# Patient Record
Sex: Female | Born: 1990
Health system: Southern US, Community
[De-identification: ages and names within clinical notes are randomized; demographics above are authoritative.]

## PROBLEM LIST (undated history)

## (undated) ENCOUNTER — Inpatient Hospital Stay (HOSPITAL_COMMUNITY): Payer: Self-pay

## (undated) DIAGNOSIS — Z789 Other specified health status: Secondary | ICD-10-CM

## (undated) DIAGNOSIS — D649 Anemia, unspecified: Secondary | ICD-10-CM

## (undated) DIAGNOSIS — F419 Anxiety disorder, unspecified: Secondary | ICD-10-CM

## (undated) DIAGNOSIS — F32A Depression, unspecified: Secondary | ICD-10-CM

## (undated) DIAGNOSIS — F329 Major depressive disorder, single episode, unspecified: Secondary | ICD-10-CM

## (undated) DIAGNOSIS — O139 Gestational [pregnancy-induced] hypertension without significant proteinuria, unspecified trimester: Secondary | ICD-10-CM

## (undated) HISTORY — DX: Gestational (pregnancy-induced) hypertension without significant proteinuria, unspecified trimester: O13.9

## (undated) HISTORY — PX: NO PAST SURGERIES: SHX2092

## (undated) HISTORY — DX: Other specified health status: Z78.9

---
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
## 2011-03-08 NOTE — L&D Delivery Note (Signed)
Delivery Note At 6:57 PM a viable female was delivered via Vaginal, Spontaneous Delivery (Presentation: ; Occiput Anterior).  APGAR: 9, 9; weight .   Placenta status: Intact, Spontaneous.  Cord: 3 vessels with the following complications: None.  Cord pH: not done  Anesthesia: Epidural  Episiotomy: None Lacerations: 1st degree;Sulcus Suture Repair: 2.0 vicryl Est. Blood Loss (mL):   Mom to postpartum.  Baby to nursery-stable.  Alaa Mullally A 09/06/2011, 7:07 PM

## 2011-05-30 ENCOUNTER — Encounter (HOSPITAL_COMMUNITY): Payer: Self-pay | Admitting: *Deleted

## 2011-05-30 ENCOUNTER — Inpatient Hospital Stay (HOSPITAL_COMMUNITY)
Admission: AD | Admit: 2011-05-30 | Discharge: 2011-05-30 | Disposition: A | Discharge status: Home / Self Care | Payer: Medicaid Other | Source: Ambulatory Visit | Attending: Obstetrics | Admitting: Obstetrics

## 2011-05-30 DIAGNOSIS — O4700 False labor before 37 completed weeks of gestation, unspecified trimester: Secondary | ICD-10-CM | POA: Insufficient documentation

## 2011-05-30 LAB — URINALYSIS, ROUTINE W REFLEX MICROSCOPIC
Nitrite: NEGATIVE
Protein, ur: NEGATIVE mg/dL
Specific Gravity, Urine: <1.005 — ABNORMAL LOW (ref 1.005–1.030)
Urobilinogen, UA: 0.2 mg/dL (ref 0.0–1.0)

## 2011-05-30 LAB — WET PREP, GENITAL: Trich, Wet Prep: NONE SEEN

## 2011-05-30 LAB — URINE MICROSCOPIC-ADD ON

## 2011-05-30 MED ORDER — NIFEDIPINE ER OSMOTIC RELEASE 60 MG PO TB24: 60.0000 mg | ORAL_TABLET | Freq: Every day | ORAL | Status: DC

## 2011-05-30 MED ORDER — METRONIDAZOLE 500 MG PO TABS: 500.0000 mg | ORAL_TABLET | Freq: Two times a day (BID) | ORAL | Status: AC

## 2011-05-30 MED ORDER — TERBUTALINE SULFATE 1 MG/ML IJ SOLN
INTRAMUSCULAR | Status: AC
  Administered 2011-05-30: 0.25 mg via SUBCUTANEOUS
  Filled 2011-05-30: qty 1

## 2011-05-30 MED ORDER — TERBUTALINE SULFATE 1 MG/ML IJ SOLN: 0.2500 mg | Freq: Once | INTRAMUSCULAR | Status: DC

## 2011-05-30 NOTE — MAU Provider Note (Signed)
  History     CSN: [id number]  Arrival date and time: 05/30/11 1422   First Provider Initiated Contact with Patient 05/30/11 1455      Chief Complaint  Patient presents with  . Contractions   HPI 21 year old primigravida at 25 weeks 0 days brought in by her mother due to 1 hour history of intermittent abdominal pains occurring about every 3 minutes and lasting several seconds. Of note they state she's been having vaginal spotting throughout the pregnancy and is on bed rest and pelvic rest. She was treated for Chlamydia about a month ago. Denies leakage of fluid. Good fetal movement.     OB History    Grav Para Term Preterm Abortions TAB SAB Ect Mult Living   1 0 0 0 0 0 0 0 0 0       Past Medical History  Diagnosis Date  . No pertinent past medical history     Past Surgical History  Procedure Date  . No past surgeries     Family History  Problem Relation Age of Onset  . Hypertension Father     History  Substance Use Topics  . Smoking status: Never Smoker   . Smokeless tobacco: Not on file  . Alcohol Use: No   Allergies: No Known Allergies  Prescriptions prior to admission  Medication Sig Dispense Refill  . ibuprofen (ADVIL,MOTRIN) 200 MG tablet Take 400 mg by mouth every 6 (six) hours as needed. For headache        ROS Physical Exam   Blood pressure 131/59, pulse 90, temperature 98.6 F (37 C), temperature source Oral, resp. rate 18, height 5\' 3"  (1.6 m), weight 58.968 kg (130 lb).  Physical Exam  Constitutional: She is oriented to person, place, and time. She appears well-developed and well-nourished. No distress.  HENT:  Head: Normocephalic.  Neck: Normal range of motion.  GI: Soft. There is no tenderness.       S=D  Genitourinary:       Small amt pink blood on Q tips. GC/CT, WP sent. fFN obtained, not sent.  VE: cx soft, mid, L/C/H  Musculoskeletal: Normal range of motion.  Neurological: She is alert and oriented to person, place, and time.    Skin: Skin is warm and dry.  Psychiatric: She has a normal mood and affect.    MAU Course  Procedures  Results for orders placed during the hospital encounter of 05/30/11 (from the past 24 hour(s))  WET PREP, GENITAL     Status: Abnormal   Collection Time   05/30/11  3:10 PM      Component Value Range   Yeast Wet Prep HPF POC NONE SEEN  NONE SEEN    Trich, Wet Prep NONE SEEN  NONE SEEN    Clue Cells Wet Prep HPF POC FEW (*) NONE SEEN    WBC, Wet Prep HPF POC TOO NUMEROUS TO COUNT (*) NONE SEEN     Toco: minimal UI, readjusted and few mild UCs FHR: 140's, no decles Assessment and Plan  Threatened PTL-> C/W Dr. Gaynell Face give terbutaline here and if effective, home on procardia 60 XL BV -> Rx Flagyl and F/U at [facility] in 2 days  Jennifer Elliott 05/30/2011, 3:36 PM

## 2011-05-30 NOTE — MAU Note (Signed)
Pt in for contractions.  States they started an hour ago and are about every 3 minutes.  Denies any leaking of fluid.  Denies any bleeding today, but states she has had bleeding off and on x4 weeks.  Reports a lot of pelvic pressure.  + FM.  No recent intercourse.

## 2011-05-30 NOTE — Discharge Instructions (Signed)

## 2011-05-30 NOTE — MAU Provider Note (Signed)
History     CSN: [id number]  Arrival date and time: 05/30/11 1422   First Provider Initiated Contact with Patient 05/30/11 1455      Chief Complaint  Patient presents with  . Contractions   HPI  21 year old primigravida at 25 weeks 0 days brought in by her mother due to 1 hour history of intermittent abdominal pains occurring about every 3 minutes and lasting several seconds. Of note they state she's been having vaginal spotting throughout the pregnancy and is on bed rest and pelvic rest. She was treated for Chlamydia about a month ago. Denies leakage of fluid. Good fetal movement.     OB History    Grav Para Term Preterm Abortions TAB SAB Ect Mult Living   1 0 0 0 0 0 0 0 0 0       Past Medical History  Diagnosis Date  . No pertinent past medical history     Past Surgical History  Procedure Date  . No past surgeries     Family History  Problem Relation Age of Onset  . Hypertension Father     History  Substance Use Topics  . Smoking status: Never Smoker   . Smokeless tobacco: Not on file  . Alcohol Use: No   Allergies: No Known Allergies  Prescriptions prior to admission  Medication Sig Dispense Refill  . ibuprofen (ADVIL,MOTRIN) 200 MG tablet Take 400 mg by mouth every 6 (six) hours as needed. For headache        ROS  Physical Exam   Blood pressure 131/59, pulse 90, temperature 98.6 F (37 C), temperature source Oral, resp. rate 18, height 5\' 3"  (1.6 m), weight 58.968 kg (130 lb).  Physical Exam  Constitutional: She is oriented to person, place, and time. She appears well-developed and well-nourished. No distress.  HENT:  Head: Normocephalic.  Neck: Normal range of motion.  GI: Soft. There is no tenderness.       S=D  Genitourinary:       Small amt pink blood on Q tips. GC/CT, WP sent. fFN obtained, not sent.  VE: cx soft, mid, L/C/H  Musculoskeletal: Normal range of motion.  Neurological: She is alert and oriented to person, place, and time.    Skin: Skin is warm and dry.  Psychiatric: She has a normal mood and affect.    MAU Course  Procedures   Results for orders placed during the hospital encounter of 05/30/11 (from the past 24 hour(s))  WET PREP, GENITAL     Status: Abnormal   Collection Time   05/30/11  3:10 PM      Component Value Range   Yeast Wet Prep HPF POC NONE SEEN  NONE SEEN    Trich, Wet Prep NONE SEEN  NONE SEEN    Clue Cells Wet Prep HPF POC FEW (*) NONE SEEN    WBC, Wet Prep HPF POC TOO NUMEROUS TO COUNT (*) NONE SEEN     Toco: minimal UI, readjusted and few mild UCs FHR: 140's, no decles Assessment and Plan  Threatened PTL-> C/W Dr. Gaynell Face give terbutaline here and if effective, home on procardia 60 XL BV -> Rx Flagyl and F/U at [facility] in 2 days  Bacterial vaginosis  Assumed care at 1700 from Caren Griffins, CNM Pt reports no contractions and none on monitor following terbutaline  P: D/C home with PTL precautions Flagyl 500 mg BID x7 days Procardia 60 XL daily F/U with Dr Gaynell Face Return to MAU as needed  LEFTW[place]KIRBY,  Christobal Morado 05/30/2011, 5:16 PM

## 2011-08-08 ENCOUNTER — Inpatient Hospital Stay (HOSPITAL_COMMUNITY)
Admission: AD | Admit: 2011-08-08 | Discharge: 2011-08-08 | Disposition: A | Discharge status: Home / Self Care | Payer: Medicaid Other | Source: Ambulatory Visit | Attending: Obstetrics | Admitting: Obstetrics

## 2011-08-08 ENCOUNTER — Encounter (HOSPITAL_COMMUNITY): Payer: Self-pay | Admitting: *Deleted

## 2011-08-08 DIAGNOSIS — O479 False labor, unspecified: Secondary | ICD-10-CM

## 2011-08-08 DIAGNOSIS — O2390 Unspecified genitourinary tract infection in pregnancy, unspecified trimester: Secondary | ICD-10-CM | POA: Insufficient documentation

## 2011-08-08 DIAGNOSIS — B373 Candidiasis of vulva and vagina: Secondary | ICD-10-CM

## 2011-08-08 DIAGNOSIS — B3731 Acute candidiasis of vulva and vagina: Secondary | ICD-10-CM | POA: Insufficient documentation

## 2011-08-08 DIAGNOSIS — O4700 False labor before 37 completed weeks of gestation, unspecified trimester: Secondary | ICD-10-CM | POA: Insufficient documentation

## 2011-08-08 LAB — WET PREP, GENITAL

## 2011-08-08 LAB — POCT FERN TEST: Fern Test: NEGATIVE

## 2011-08-08 LAB — AMNISURE RUPTURE OF MEMBRANE (ROM) NOT AT ARMC: Amnisure ROM: NEGATIVE

## 2011-08-08 MED ORDER — TERCONAZOLE 0.4 % VA CREA: 1.0000 | TOPICAL_CREAM | Freq: Every day | VAGINAL | Status: AC

## 2011-08-08 NOTE — MAU Note (Signed)
Patient state she has a gush of clear fluid at 0500 with contractions after. Reports good fetal movement.

## 2011-08-08 NOTE — MAU Provider Note (Signed)
Chief Complaint:  Labor Eval and Rupture of Membranes   First Provider Initiated Contact with Patient 08/08/11 0908      HPI  Jennifer Elliott is a 21 y.o. G1P0000 at [redacted]w[redacted]d presenting with one episode of LOF at 0500 followed by worsening contractions Q2-5 minutes. Denies vaginal bleeding. Good fetal movement. Pt has not tried anything to relieve UC's. Denies recent IC.  Pregnancy significant for preterm contractions w/out cervical change. Rx'd Procardia. Has not taken in several weeks.    Past Medical History: Past Medical History  Diagnosis Date  . No pertinent past medical history     Past Surgical History: Past Surgical History  Procedure Date  . No past surgeries     Family History: Family History  Problem Relation Age of Onset  . Hypertension Father     Social History: History  Substance Use Topics  . Smoking status: Never Smoker   . Smokeless tobacco: Not on file  . Alcohol Use: No    Allergies: No Known Allergies  Meds:  Prescriptions prior to admission  Medication Sig Dispense Refill  . NIFEdipine (PROCARDIA XL/ADALAT-CC) 60 MG 24 hr tablet Take 1 tablet (60 mg total) by mouth daily.  30 tablet  0  . Prenatal Vit-Fe Fumarate-FA (PRENATAL MULTIVITAMIN) TABS Take 1 tablet by mouth daily.          Physical Exam  Blood pressure 119/75, pulse 110, temperature 97.4 F (36.3 C), temperature source Oral, resp. rate 18. GENERAL: Well-developed, well-nourished female in no acute distress.  HEENT: normocephalic. HEART: normal rate RESP: normal effort ABDOMEN: Soft, nontender, nondistended, gravid, S=D. Uterus NT. EXTREMITIES: Nontender, no edema NEURO: alert and oriented SPECULUM EXAM: Moderate amount of curd-like, odorless discharge w/ small amount of milky, white discharge. Cervix non-friable, w/out lesions or discharge.  Dilation: Closed Effacement (%): 50 Cervical Position: Posterior Station: -2 Presentation: Vertex Exam by:: Ivonne Andrew CNM  FHT:   Baseline 130 , moderate variability, accelerations present, no decelerations Contractions: q 3-7 mins, decreased w/ PO fluids  No cervical change after one hour. No leaking during MAU visit.   Labs: Results for orders placed during the hospital encounter of 08/08/11 (from the past 48 hour(s))  WET PREP, GENITAL     Status: Abnormal   Collection Time   08/08/11  9:20 AM      Component Value Range Comment   Yeast Wet Prep HPF POC FEW (*) NONE SEEN     Trich, Wet Prep NONE SEEN  NONE SEEN     Clue Cells Wet Prep HPF POC FEW (*) NONE SEEN     WBC, Wet Prep HPF POC TOO NUMEROUS TO COUNT (*) NONE SEEN  MODERATE BACTERIA SEEN  CULTURE, BETA STREP (GROUP B ONLY)     Status: Normal (Preliminary result)   Collection Time   08/08/11  9:20 AM      Component Value Range Comment    Pending                 Report Status PENDING     POCT FERN TEST     Status: Normal   Collection Time   08/08/11  9:22 AM      Component Value Range Comment   Fern Test Negative     AMNISURE RUPTURE OF MEMBRANE (ROM)     Status: Normal   Collection Time   08/08/11  9:55 AM      Component Value Range Comment   Amnisure ROM NEGATIVE  Imaging:  NA  Assessment: 1. Vulvovaginal candidiasis   2. Preterm contractions   3.  Intact membranes  Plan: D/C home Pelvic rest x 1 week. Increase fluids and rest Follow-up Information    Follow up with MARSHALL,BERNARD A, MD. (as scheduled or MAU as needed if symptoms worsen)    Contact information:   [address] [place] [address] [phone]         Medication List  As of 08/09/2011  1:47 PM   START taking these medications         terconazole 0.4 % vaginal cream   Commonly known as: TERAZOL 7   Place 1 applicator vaginally at bedtime.         CONTINUE taking these medications         NIFEdipine 60 MG 24 hr tablet   Commonly known as: PROCARDIA XL/ADALAT-CC   Take 1 tablet (60 mg total) by mouth daily.      prenatal  multivitamin Tabs          Where to get your medications    These are the prescriptions that you need to pick up.   You may get these medications from any pharmacy.         terconazole 0.4 % vaginal cream          PTL precautions FKCs  Debera Sterba 6/3/20139:27 AM

## 2011-08-12 LAB — CULTURE, BETA STREP (GROUP B ONLY)

## 2011-08-14 ENCOUNTER — Encounter: Payer: Self-pay | Admitting: Advanced Practice Midwife

## 2011-08-14 DIAGNOSIS — O99820 Streptococcus B carrier state complicating pregnancy: Secondary | ICD-10-CM

## 2011-09-06 ENCOUNTER — Inpatient Hospital Stay (HOSPITAL_COMMUNITY): Payer: Medicaid Other | Admitting: Anesthesiology

## 2011-09-06 ENCOUNTER — Encounter (HOSPITAL_COMMUNITY): Payer: Self-pay | Admitting: Anesthesiology

## 2011-09-06 ENCOUNTER — Encounter (HOSPITAL_COMMUNITY): Payer: Self-pay | Admitting: *Deleted

## 2011-09-06 ENCOUNTER — Inpatient Hospital Stay (HOSPITAL_COMMUNITY)
Admission: AD | Admit: 2011-09-06 | Discharge: 2011-09-08 | DRG: 775 | Disposition: A | Discharge status: Home / Self Care | Payer: Medicaid Other | Source: Ambulatory Visit | Attending: Obstetrics | Admitting: Obstetrics

## 2011-09-06 DIAGNOSIS — O99892 Other specified diseases and conditions complicating childbirth: Secondary | ICD-10-CM | POA: Diagnosis present

## 2011-09-06 DIAGNOSIS — O99820 Streptococcus B carrier state complicating pregnancy: Secondary | ICD-10-CM

## 2011-09-06 DIAGNOSIS — Z22330 Carrier of Group B streptococcus: Secondary | ICD-10-CM

## 2011-09-06 HISTORY — DX: Depression, unspecified: F32.A

## 2011-09-06 HISTORY — DX: Major depressive disorder, single episode, unspecified: F32.9

## 2011-09-06 HISTORY — DX: Anxiety disorder, unspecified: F41.9

## 2011-09-06 HISTORY — DX: Anemia, unspecified: D64.9

## 2011-09-06 LAB — CBC
HCT: 34 % — ABNORMAL LOW (ref 36.0–46.0)
Hemoglobin: 11 g/dL — ABNORMAL LOW (ref 12.0–15.0)
MCH: 27.6 pg (ref 26.0–34.0)
MCV: 85.4 fL (ref 78.0–100.0)
Platelets: 180 10*3/uL (ref 150–400)
RBC: 3.98 MIL/uL (ref 3.87–5.11)
WBC: 8.2 10*3/uL (ref 4.0–10.5)

## 2011-09-06 LAB — OB RESULTS CONSOLE ABO/RH: RH Type: POSITIVE

## 2011-09-06 LAB — OB RESULTS CONSOLE ANTIBODY SCREEN: Antibody Screen: NEGATIVE

## 2011-09-06 LAB — OB RESULTS CONSOLE HIV ANTIBODY (ROUTINE TESTING): HIV: NONREACTIVE

## 2011-09-06 MED ORDER — ACETAMINOPHEN 325 MG PO TABS: 650.0000 mg | ORAL_TABLET | ORAL | Status: DC | PRN

## 2011-09-06 MED ORDER — TETANUS-DIPHTH-ACELL PERTUSSIS 5-2.5-18.5 LF-MCG/0.5 IM SUSP
0.5000 mL | Freq: Once | INTRAMUSCULAR | Status: AC
  Administered 2011-09-07: 0.5 mL via INTRAMUSCULAR
  Filled 2011-09-06: qty 0.5

## 2011-09-06 MED ORDER — OXYTOCIN 40 UNITS IN LACTATED RINGERS INFUSION - SIMPLE MED
1.0000 m[IU]/min | INTRAVENOUS | Status: DC
  Administered 2011-09-06: 1 m[IU]/min via INTRAVENOUS
  Administered 2011-09-06: 666 m[IU]/min via INTRAVENOUS
  Filled 2011-09-06: qty 1000

## 2011-09-06 MED ORDER — LACTATED RINGERS IV SOLN
INTRAVENOUS | Status: DC
  Administered 2011-09-06 (×2): via INTRAVENOUS

## 2011-09-06 MED ORDER — BUTORPHANOL TARTRATE 2 MG/ML IJ SOLN: 1.0000 mg | INTRAMUSCULAR | Status: DC | PRN

## 2011-09-06 MED ORDER — ONDANSETRON HCL 4 MG PO TABS: 4.0000 mg | ORAL_TABLET | ORAL | Status: DC | PRN

## 2011-09-06 MED ORDER — FENTANYL 2.5 MCG/ML BUPIVACAINE 1/10 % EPIDURAL INFUSION (WH - ANES)
INTRAMUSCULAR | Status: DC | PRN
  Administered 2011-09-06: 14 mL/h via EPIDURAL

## 2011-09-06 MED ORDER — OXYTOCIN 40 UNITS IN LACTATED RINGERS INFUSION - SIMPLE MED: 62.5000 mL/h | Freq: Once | INTRAVENOUS | Status: DC

## 2011-09-06 MED ORDER — SIMETHICONE 80 MG PO CHEW: 80.0000 mg | CHEWABLE_TABLET | ORAL | Status: DC | PRN

## 2011-09-06 MED ORDER — LACTATED RINGERS IV SOLN: 500.0000 mL | INTRAVENOUS | Status: DC | PRN

## 2011-09-06 MED ORDER — OXYCODONE-ACETAMINOPHEN 5-325 MG PO TABS
1.0000 | ORAL_TABLET | ORAL | Status: DC | PRN
  Administered 2011-09-08: 1 via ORAL
  Filled 2011-09-06: qty 1

## 2011-09-06 MED ORDER — OXYCODONE-ACETAMINOPHEN 5-325 MG PO TABS: 1.0000 | ORAL_TABLET | ORAL | Status: DC | PRN

## 2011-09-06 MED ORDER — LIDOCAINE HCL (PF) 1 % IJ SOLN
INTRAMUSCULAR | Status: DC | PRN
  Administered 2011-09-06: 7 mL
  Administered 2011-09-06: 6 mL

## 2011-09-06 MED ORDER — DIBUCAINE 1 % RE OINT: 1.0000 "application " | TOPICAL_OINTMENT | RECTAL | Status: DC | PRN

## 2011-09-06 MED ORDER — FERROUS SULFATE 325 (65 FE) MG PO TABS
325.0000 mg | ORAL_TABLET | Freq: Two times a day (BID) | ORAL | Status: DC
  Administered 2011-09-07 – 2011-09-08 (×3): 325 mg via ORAL
  Filled 2011-09-06 (×3): qty 1

## 2011-09-06 MED ORDER — PENICILLIN G POTASSIUM 5000000 UNITS IJ SOLR
2.5000 10*6.[IU] | INTRAVENOUS | Status: DC
  Administered 2011-09-06: 2.5 10*6.[IU] via INTRAVENOUS
  Filled 2011-09-06 (×4): qty 2.5

## 2011-09-06 MED ORDER — PRENATAL MULTIVITAMIN CH
1.0000 | ORAL_TABLET | Freq: Every day | ORAL | Status: DC
  Administered 2011-09-07 – 2011-09-08 (×2): 1 via ORAL
  Filled 2011-09-06 (×2): qty 1

## 2011-09-06 MED ORDER — SENNOSIDES-DOCUSATE SODIUM 8.6-50 MG PO TABS
2.0000 | ORAL_TABLET | Freq: Every day | ORAL | Status: DC
  Administered 2011-09-07: 2 via ORAL

## 2011-09-06 MED ORDER — OXYTOCIN BOLUS FROM INFUSION
250.0000 mL | Freq: Once | INTRAVENOUS | Status: DC
  Filled 2011-09-06: qty 500

## 2011-09-06 MED ORDER — LACTATED RINGERS IV SOLN: 500.0000 mL | Freq: Once | INTRAVENOUS | Status: DC

## 2011-09-06 MED ORDER — LIDOCAINE HCL (PF) 1 % IJ SOLN: 30.0000 mL | INTRAMUSCULAR | Status: DC | PRN

## 2011-09-06 MED ORDER — FLEET ENEMA 7-19 GM/118ML RE ENEM: 1.0000 | ENEMA | RECTAL | Status: DC | PRN

## 2011-09-06 MED ORDER — DIPHENHYDRAMINE HCL 25 MG PO CAPS: 25.0000 mg | ORAL_CAPSULE | Freq: Four times a day (QID) | ORAL | Status: DC | PRN

## 2011-09-06 MED ORDER — LANOLIN HYDROUS EX OINT: TOPICAL_OINTMENT | CUTANEOUS | Status: DC | PRN

## 2011-09-06 MED ORDER — EPHEDRINE 5 MG/ML INJ
10.0000 mg | INTRAVENOUS | Status: DC | PRN
  Filled 2011-09-06: qty 4

## 2011-09-06 MED ORDER — CITRIC ACID-SODIUM CITRATE 334-500 MG/5ML PO SOLN: 30.0000 mL | ORAL | Status: DC | PRN

## 2011-09-06 MED ORDER — DIPHENHYDRAMINE HCL 50 MG/ML IJ SOLN: 12.5000 mg | INTRAMUSCULAR | Status: DC | PRN

## 2011-09-06 MED ORDER — WITCH HAZEL-GLYCERIN EX PADS: 1.0000 "application " | MEDICATED_PAD | CUTANEOUS | Status: DC | PRN

## 2011-09-06 MED ORDER — BENZOCAINE-MENTHOL 20-0.5 % EX AERO: 1.0000 "application " | INHALATION_SPRAY | CUTANEOUS | Status: DC | PRN

## 2011-09-06 MED ORDER — EPHEDRINE 5 MG/ML INJ: 10.0000 mg | INTRAVENOUS | Status: DC | PRN

## 2011-09-06 MED ORDER — FENTANYL 2.5 MCG/ML BUPIVACAINE 1/10 % EPIDURAL INFUSION (WH - ANES)
14.0000 mL/h | INTRAMUSCULAR | Status: DC
  Administered 2011-09-06: 14 mL/h via EPIDURAL
  Filled 2011-09-06 (×2): qty 60

## 2011-09-06 MED ORDER — IBUPROFEN 600 MG PO TABS
600.0000 mg | ORAL_TABLET | Freq: Four times a day (QID) | ORAL | Status: DC
  Administered 2011-09-07 – 2011-09-08 (×5): 600 mg via ORAL
  Filled 2011-09-06: qty 1

## 2011-09-06 MED ORDER — PHENYLEPHRINE 40 MCG/ML (10ML) SYRINGE FOR IV PUSH (FOR BLOOD PRESSURE SUPPORT): 80.0000 ug | PREFILLED_SYRINGE | INTRAVENOUS | Status: DC | PRN

## 2011-09-06 MED ORDER — ONDANSETRON HCL 4 MG/2ML IJ SOLN: 4.0000 mg | INTRAMUSCULAR | Status: DC | PRN

## 2011-09-06 MED ORDER — TERBUTALINE SULFATE 1 MG/ML IJ SOLN: 0.2500 mg | Freq: Once | INTRAMUSCULAR | Status: DC | PRN

## 2011-09-06 MED ORDER — ZOLPIDEM TARTRATE 5 MG PO TABS: 5.0000 mg | ORAL_TABLET | Freq: Every evening | ORAL | Status: DC | PRN

## 2011-09-06 MED ORDER — ONDANSETRON HCL 4 MG/2ML IJ SOLN: 4.0000 mg | Freq: Four times a day (QID) | INTRAMUSCULAR | Status: DC | PRN

## 2011-09-06 MED ORDER — IBUPROFEN 600 MG PO TABS
600.0000 mg | ORAL_TABLET | Freq: Four times a day (QID) | ORAL | Status: DC | PRN
  Administered 2011-09-06: 600 mg via ORAL
  Filled 2011-09-06 (×4): qty 1

## 2011-09-06 MED ORDER — PENICILLIN G POTASSIUM 5000000 UNITS IJ SOLR
5.0000 10*6.[IU] | Freq: Once | INTRAVENOUS | Status: AC
  Administered 2011-09-06: 5 10*6.[IU] via INTRAVENOUS
  Filled 2011-09-06: qty 5

## 2011-09-06 MED ORDER — PHENYLEPHRINE 40 MCG/ML (10ML) SYRINGE FOR IV PUSH (FOR BLOOD PRESSURE SUPPORT)
80.0000 ug | PREFILLED_SYRINGE | INTRAVENOUS | Status: DC | PRN
  Filled 2011-09-06: qty 5

## 2011-09-06 NOTE — H&P (Signed)
This is Dr. Selinda Orion at [facility] dictated history and physical on  Jennifer Elliott  she is a 21 year old  primip at  39 weeks and two days  a positive GBS and received penicillin her membranes ruptured spontaneously  At 755 am and the cervix is 1 cm 100% and vertex at a 0 station the fluid  is clear and she is contracting every 4 minutes Past medical history negative Part surgical history measurement Family history negative System review noncontributory Physical exam revealed a well-developed female in early labor HEENT negative Lungs clear Heart regular rhythm no murmurs no gallops Breasts engorged Abdomen her Pelvic as described above Extremities negative

## 2011-09-06 NOTE — MAU Note (Signed)
4098 started leaking clear fluid, still coming.  Cramping started at 0830.  Denies problems with preg, was 1 cm when last checked.

## 2011-09-06 NOTE — Anesthesia Preprocedure Evaluation (Signed)
Anesthesia Evaluation  Patient identified by MRN, date of birth, ID band Patient awake    Reviewed: Allergy & Precautions, H&P , NPO status , Patient's Chart, lab work & pertinent test results  Airway Mallampati: I TM Distance: >3 FB Neck ROM: full    Dental No notable dental hx.    Pulmonary neg pulmonary ROS,  breath sounds clear to auscultation  Pulmonary exam normal       Cardiovascular negative cardio ROS      Neuro/Psych PSYCHIATRIC DISORDERS Anxiety Depression negative neurological ROS     GI/Hepatic negative GI ROS, Neg liver ROS,   Endo/Other  negative endocrine ROS  Renal/GU negative Renal ROS  negative genitourinary   Musculoskeletal negative musculoskeletal ROS (+)   Abdominal Normal abdominal exam  (+)   Peds negative pediatric ROS (+)  Hematology negative hematology ROS (+)   Anesthesia Other Findings   Reproductive/Obstetrics (+) Pregnancy                           Anesthesia Physical Anesthesia Plan  ASA: II  Anesthesia Plan: Epidural   Post-op Pain Management:    Induction:   Airway Management Planned:   Additional Equipment:   Intra-op Plan:   Post-operative Plan:   Informed Consent: I have reviewed the patients History and Physical, chart, labs and discussed the procedure including the risks, benefits and alternatives for the proposed anesthesia with the patient or authorized representative who has indicated his/her understanding and acceptance.     Plan Discussed with:   Anesthesia Plan Comments:         Anesthesia Quick Evaluation

## 2011-09-06 NOTE — Anesthesia Procedure Notes (Signed)
Epidural Patient location during procedure: OB Start time: 09/06/2011 11:55 AM End time: 09/06/2011 11:59 AM Reason for block: procedure for pain  Staffing Anesthesiologist: Sandrea Hughs Performed by: anesthesiologist   Preanesthetic Checklist Completed: patient identified, site marked, surgical consent, pre-op evaluation, timeout performed, IV checked, risks and benefits discussed and monitors and equipment checked  Epidural Patient position: sitting Prep: site prepped and draped and DuraPrep Patient monitoring: continuous pulse ox and blood pressure Approach: midline Injection technique: LOR air  Needle:  Needle type: Tuohy  Needle gauge: 17 G Needle length: 9 cm Needle insertion depth: 5 cm cm Catheter type: closed end flexible Catheter size: 19 Gauge Catheter at skin depth: 10 cm Test dose: negative  Assessment Sensory level: T8 Events: blood not aspirated, injection not painful, no injection resistance, negative IV test and no paresthesia

## 2011-09-07 LAB — CBC
MCH: 28.1 pg (ref 26.0–34.0)
MCHC: 32.7 g/dL (ref 30.0–36.0)
Platelets: 176 10*3/uL (ref 150–400)
RBC: 3.66 MIL/uL — ABNORMAL LOW (ref 3.87–5.11)

## 2011-09-07 NOTE — Progress Notes (Signed)
UR chart review completed.  

## 2011-09-07 NOTE — Progress Notes (Signed)
Patient ID: Jennifer Elliott, female   DOB: [birth year], 21 y.o.   MRN: [id number] Postpartum day one Fundus firm Lochia moderate Legs negative No complaints

## 2011-09-07 NOTE — Anesthesia Postprocedure Evaluation (Signed)
  Anesthesia Post-op Note  Patient: Jennifer Elliott  Procedure(s) Performed: * No procedures listed *  Patient Location: Mother/Baby  Anesthesia Type: Epidural  Level of Consciousness: awake  Airway and Oxygen Therapy: Patient Spontanous Breathing  Post-op Pain: none  Post-op Assessment: Patient's Cardiovascular Status Stable, Respiratory Function Stable, No signs of Nausea or vomiting, Adequate PO intake, Pain level controlled, No headache, No backache, No residual numbness and No residual motor weakness  Post-op Vital Signs: Reviewed and stable  Complications: No apparent anesthesia complications

## 2011-09-08 NOTE — Discharge Summary (Signed)
Obstetric Discharge Summary Reason for Admission: onset of labor Prenatal Procedures: none Intrapartum Procedures: spontaneous vaginal delivery Postpartum Procedures: none Complications-Operative and Postpartum: none Hemoglobin  Date Value Range Status  09/07/2011 10.3* 12.0 - 15.0 g/dL Final     HCT  Date Value Range Status  09/07/2011 31.5* 36.0 - 46.0 % Final    Physical Exam:  General: alert Lochia: appropriate Uterine Fundus: firm Incision: healing well DVT Evaluation: No evidence of DVT seen on physical exam.  Discharge Diagnoses: Term Pregnancy-delivered  Discharge Information: Date: 09/08/2011 Activity: pelvic rest Diet: routine Medications: Percocet Condition: stable Instructions: refer to practice specific booklet Discharge to: home Follow-up Information    Follow up with Mainor Hellmann A, MD. Call in 6 weeks.   Contact information:   [address] [place] [address] [phone]          Newborn Data: Live born female  Birth Weight: 5 lb 12.6 oz (2625 g) APGAR: 9, 9  Home with mother.  Jennifer Elliott A 09/08/2011, 7:02 AM

## 2014-01-06 ENCOUNTER — Encounter (HOSPITAL_COMMUNITY): Payer: Self-pay | Admitting: *Deleted

## 2015-04-15 ENCOUNTER — Inpatient Hospital Stay (HOSPITAL_COMMUNITY)
Admission: AD | Admit: 2015-04-15 | Discharge: 2015-04-15 | Disposition: A | Discharge status: Home / Self Care | Payer: Medicaid Other | Source: Ambulatory Visit | Attending: Obstetrics and Gynecology | Admitting: Obstetrics and Gynecology

## 2015-04-15 DIAGNOSIS — M545 Low back pain: Secondary | ICD-10-CM | POA: Insufficient documentation

## 2015-04-15 LAB — URINALYSIS, ROUTINE W REFLEX MICROSCOPIC
Bilirubin Urine: NEGATIVE
GLUCOSE, UA: NEGATIVE mg/dL
KETONES UR: NEGATIVE mg/dL
Nitrite: NEGATIVE
PH: 5.5 (ref 5.0–8.0)
Protein, ur: NEGATIVE mg/dL
Specific Gravity, Urine: 1.02 (ref 1.005–1.030)

## 2015-04-15 LAB — URINE MICROSCOPIC-ADD ON: RBC / HPF: NONE SEEN RBC/hpf (ref 0–5)

## 2015-04-15 LAB — POCT PREGNANCY, URINE: Preg Test, Ur: NEGATIVE

## 2015-04-15 NOTE — MAU Provider Note (Signed)
Ms Enis Gash is a 24yo G1P1001 here for vague, unrelated symptoms of low back discomfort and occ vomiting w/ the last time being 1.5wks ago.  [facility] in January with all results negative (per pt). Has Nexplanon currently in place. Interested in finding out pregnancy status. No fever, no dysuria.   UA- mod hgb, tr leuk Micro- 0-5 SE, rare bacteria  Rev'd neg UPT. Pt not interested in having further eval of intermittent low back discomfort. Will send urine to culture as precaution.  Cam Hai CNM 04/15/2015 10:35 PM

## 2015-04-15 NOTE — MAU Note (Signed)
Pt states she has been having lower abd pain and lower back pain x 3 days. States she thinks she might be pregnant due to nausea. Negative preg test one week ago.

## 2015-04-17 LAB — URINE CULTURE: SPECIAL REQUESTS: NORMAL

## 2016-08-13 ENCOUNTER — Emergency Department (HOSPITAL_COMMUNITY)
Admission: EM | Admit: 2016-08-13 | Discharge: 2016-08-13 | Disposition: A | Discharge status: Home / Self Care | Payer: Medicaid Other | Attending: Emergency Medicine | Admitting: Emergency Medicine

## 2016-08-13 ENCOUNTER — Emergency Department (HOSPITAL_COMMUNITY): Payer: Medicaid Other

## 2016-08-13 ENCOUNTER — Encounter (HOSPITAL_COMMUNITY): Payer: Self-pay | Admitting: Emergency Medicine

## 2016-08-13 DIAGNOSIS — B349 Viral infection, unspecified: Secondary | ICD-10-CM | POA: Diagnosis not present

## 2016-08-13 DIAGNOSIS — R079 Chest pain, unspecified: Secondary | ICD-10-CM | POA: Diagnosis present

## 2016-08-13 DIAGNOSIS — Z87891 Personal history of nicotine dependence: Secondary | ICD-10-CM | POA: Insufficient documentation

## 2016-08-13 LAB — CBC
HEMATOCRIT: 40.9 % (ref 36.0–46.0)
Hemoglobin: 13.4 g/dL (ref 12.0–15.0)
MCH: 31.3 pg (ref 26.0–34.0)
MCHC: 32.8 g/dL (ref 30.0–36.0)
MCV: 95.6 fL (ref 78.0–100.0)
PLATELETS: 237 10*3/uL (ref 150–400)
RBC: 4.28 MIL/uL (ref 3.87–5.11)
RDW: 14.1 % (ref 11.5–15.5)
WBC: 6.3 10*3/uL (ref 4.0–10.5)

## 2016-08-13 LAB — BASIC METABOLIC PANEL
Anion gap: 8 (ref 5–15)
BUN: 11 mg/dL (ref 6–20)
CHLORIDE: 106 mmol/L (ref 101–111)
CO2: 21 mmol/L — ABNORMAL LOW (ref 22–32)
CREATININE: 0.75 mg/dL (ref 0.44–1.00)
Calcium: 9.2 mg/dL (ref 8.9–10.3)
GFR calc Af Amer: >60 mL/min (ref >60)
GFR calc non Af Amer: >60 mL/min (ref >60)
GLUCOSE: 93 mg/dL (ref 65–99)
POTASSIUM: 3.6 mmol/L (ref 3.5–5.1)
SODIUM: 135 mmol/L (ref 135–145)

## 2016-08-13 LAB — RAPID STREP SCREEN (MED CTR MEBANE ONLY): STREPTOCOCCUS, GROUP A SCREEN (DIRECT): NEGATIVE

## 2016-08-13 LAB — I-STAT TROPONIN, ED: Troponin i, poc: 0 ng/mL (ref 0.00–0.08)

## 2016-08-13 MED ORDER — IBUPROFEN 800 MG PO TABS
800.0000 mg | ORAL_TABLET | Freq: Once | ORAL | Status: AC
  Administered 2016-08-13: 800 mg via ORAL
  Filled 2016-08-13: qty 1

## 2016-08-13 NOTE — ED Triage Notes (Signed)
Pt c/o left sided chest pain that shoots across to right side with dizziness ongoing for 2 weeks. Pt reports sore throat x 1 week.

## 2016-08-13 NOTE — ED Provider Notes (Signed)
[facility]-EMERGENCY DEPT Provider Note   CSN: [id number] Arrival date & time: 08/13/16  1316     History   Chief Complaint Chief Complaint  Patient presents with  . Chest Pain  . Sore Throat    HPI Jennifer Elliott is a 26 y.o. female.  HPI  26 y.o. Female with uri sxs, sore throat and left and right chest pressure.  No cough, dyspnea, or weakness.  She has had some subjective fever, chills, and nausea without vomiting.  Symptoms improving.    Past Medical History:  Diagnosis Date  . Anemia   . Anxiety   . Depression    doing good    Patient Active Problem List   Diagnosis Date Noted  . GBS (group B Streptococcus carrier), +RV culture, currently pregnant 08/14/2011    Past Surgical History:  Procedure Laterality Date  . NO PAST SURGERIES      OB History    Gravida Para Term Preterm AB Living   1 1 1  0 0 1   SAB TAB Ectopic Multiple Live Births   0 0 0 0 1       Home Medications    Prior to Admission medications   Not on File    Family History Family History  Problem Relation Age of Onset  . Hypertension Father   . Other Neg Hx     Social History Social History  Substance Use Topics  . Smoking status: Former Games developer  . Smokeless tobacco: Never Used     Comment: 2012  . Alcohol use No     Allergies   Patient has no known allergies.   Review of Systems Review of Systems  All other systems reviewed and are negative.    Physical Exam Updated Vital Signs BP 131/77   Pulse 71   Temp 98.5 F (36.9 C) (Oral)   Resp 18   Ht 1.6 m (5\' 3" )   Wt 48.5 kg (107 lb)   LMP 07/18/2016   SpO2 100%   BMI 18.95 kg/m   Physical Exam  Constitutional: She is oriented to person, place, and time. She appears well-developed and well-nourished. No distress.  HENT:  Head: Normocephalic and atraumatic.  Right Ear: External ear normal.  Left Ear: External ear normal.  Nose: Nose normal.  Eyes: Conjunctivae and EOM are normal. Pupils are equal, round,  and reactive to light.  Neck: Normal range of motion. Neck supple.  Cardiovascular: Normal rate.   Pulmonary/Chest: Effort normal.  Abdominal: Soft.  Musculoskeletal: Normal range of motion.  Neurological: She is alert and oriented to person, place, and time. She exhibits normal muscle tone. Coordination normal.  Skin: Skin is warm and dry.  Psychiatric: She has a normal mood and affect. Her behavior is normal. Thought content normal.  Nursing note and vitals reviewed.    ED Treatments / Results  Labs (all labs ordered are listed, but only abnormal results are displayed) Labs Reviewed  BASIC METABOLIC PANEL - Abnormal; Notable for the following:       Result Value   CO2 21 (*)    All other components within normal limits  RAPID STREP SCREEN (NOT AT [facility])  CULTURE, GROUP A STREP [facility])  CBC  I-STAT TROPOININ, ED    EKG  EKG Interpretation  Date/Time:  Saturday August 13 2016 13:24:27 EDT Ventricular Rate:  72 PR Interval:  134 QRS Duration: 84 QT Interval:  402 QTC Calculation: 440 R Axis:   88 Text Interpretation:  Normal  sinus rhythm with sinus arrhythmia Normal ECG Confirmed by Shayden Bobier MD, Duwayne HeckANIELLE [phone]) on 08/13/2016 3:51:11 PM       Radiology Dg Chest 2 View  Result Date: 08/13/2016 CLINICAL DATA:  Chest pain EXAM: CHEST  2 VIEW COMPARISON:  None. FINDINGS: Lungs are clear. Heart size and pulmonary vascularity are normal. No adenopathy. No pneumothorax. No bone lesions. IMPRESSION: No edema or consolidation. Electronically Signed   By: Bretta BangWilliam  Woodruff III M.D.   On: 08/13/2016 14:17    Procedures Procedures (including critical care time)  Medications Ordered in ED Medications - No data to display   Initial Impression / Assessment and Plan / ED Course  I have reviewed the triage vital signs and the nursing notes.  Pertinent labs & imaging results that were available during my care of the patient were reviewed by me and considered in my medical decision making  (see chart for details).       Final Clinical Impressions(s) / ED Diagnoses   Final diagnoses:  Viral syndrome  Chest pain, unspecified type    New Prescriptions New Prescriptions   No medications on file     Margarita Grizzleay, Pierre Cumpton, MD 08/13/16 1609

## 2016-08-15 LAB — CULTURE, GROUP A STREP (THRC)

## 2016-08-21 ENCOUNTER — Emergency Department (HOSPITAL_COMMUNITY)
Admission: EM | Admit: 2016-08-21 | Discharge: 2016-08-21 | Disposition: A | Discharge status: Home / Self Care | Payer: Medicaid Other | Attending: Emergency Medicine | Admitting: Emergency Medicine

## 2016-08-21 ENCOUNTER — Encounter (HOSPITAL_COMMUNITY): Payer: Self-pay | Admitting: Emergency Medicine

## 2016-08-21 ENCOUNTER — Emergency Department (HOSPITAL_COMMUNITY): Payer: Medicaid Other

## 2016-08-21 DIAGNOSIS — Y92481 Parking lot as the place of occurrence of the external cause: Secondary | ICD-10-CM | POA: Insufficient documentation

## 2016-08-21 DIAGNOSIS — Y9302 Activity, running: Secondary | ICD-10-CM | POA: Diagnosis not present

## 2016-08-21 DIAGNOSIS — S8002XA Contusion of left knee, initial encounter: Secondary | ICD-10-CM | POA: Diagnosis not present

## 2016-08-21 DIAGNOSIS — Z87891 Personal history of nicotine dependence: Secondary | ICD-10-CM | POA: Insufficient documentation

## 2016-08-21 DIAGNOSIS — W1839XA Other fall on same level, initial encounter: Secondary | ICD-10-CM | POA: Insufficient documentation

## 2016-08-21 DIAGNOSIS — S8992XA Unspecified injury of left lower leg, initial encounter: Secondary | ICD-10-CM | POA: Diagnosis present

## 2016-08-21 DIAGNOSIS — Y999 Unspecified external cause status: Secondary | ICD-10-CM | POA: Insufficient documentation

## 2016-08-21 MED ORDER — IBUPROFEN 800 MG PO TABS
800.0000 mg | ORAL_TABLET | Freq: Once | ORAL | Status: AC
  Administered 2016-08-21: 800 mg via ORAL
  Filled 2016-08-21: qty 1

## 2016-08-21 NOTE — Progress Notes (Signed)
Orthopedic Tech Progress Note Patient Details:  Jennifer Elliott 1991-01-11 [id number]  Ortho Devices Type of Ortho Device: Knee Immobilizer, Crutches Ortho Device/Splint Interventions: Application   Saul FordyceJennifer C Kirbie Stodghill 08/21/2016, 1:34 PM

## 2016-08-21 NOTE — ED Provider Notes (Signed)
[facility]-EMERGENCY DEPT Provider Note   CSN: [id number] Arrival date & time: 08/21/16  1057  By signing my name below, I, Jennifer Elliott, attest that this documentation has been prepared under the direction and in the presence of Margarita Grizzleay, Dyan Creelman, MD. Electronically signed, Jennifer Elliott, ED Scribe. 08/21/16. 12:40 PM.  History   Chief Complaint Chief Complaint  Patient presents with  . Knee Injury    HPI .HPI Comments: Jennifer Elliott is a 26 y.o. female who presents to the Emergency Department s/p a mechanical fall that occurred last night. Pt was running through a parking lot and caught her foot on a parking block. This caused her to fall forward, landing onto her left knee. She did not hit her head; no LOC. Pt has applied ice to the knee with no relief. Her pain is exacerbated during ambulation. She has not taken any medications PTA. No open wound.   The history is provided by the patient. No language interpreter was used.    Past Medical History:  Diagnosis Date  . Anemia   . Anxiety   . Depression    doing good    Patient Active Problem List   Diagnosis Date Noted  . GBS (group B Streptococcus carrier), +RV culture, currently pregnant 08/14/2011    Past Surgical History:  Procedure Laterality Date  . NO PAST SURGERIES      OB History    Gravida Para Term Preterm AB Living   1 1 1  0 0 1   SAB TAB Ectopic Multiple Live Births   0 0 0 0 1      Home Medications    Prior to Admission medications   Not on File    Family History Family History  Problem Relation Age of Onset  . Hypertension Father   . Other Neg Hx     Social History Social History  Substance Use Topics  . Smoking status: Former Games developermoker  . Smokeless tobacco: Never Used     Comment: 2012  . Alcohol use No     Allergies   Patient has no known allergies.   Review of Systems Review of Systems  Musculoskeletal: Positive for arthralgias (left knee) and joint swelling (left knee).    Neurological: Negative for headaches.  All other systems reviewed and are negative.   Physical Exam Updated Vital Signs BP 120/79 (BP Location: Right Arm)   Pulse 60   Temp 98.1 F (36.7 C) (Oral)   Resp (!) 22   Ht 5\' 3"  (1.6 m)   Wt 110 lb (49.9 kg)   LMP 08/18/2016   SpO2 100%   BMI 19.49 kg/m   Physical Exam  Constitutional: She is oriented to person, place, and time. She appears well-developed and well-nourished. No distress.  HENT:  Head: Normocephalic and atraumatic.  Right Ear: External ear normal.  Left Ear: External ear normal.  Nose: Nose normal.  Eyes: Conjunctivae and EOM are normal. Pupils are equal, round, and reactive to light.  Neck: Normal range of motion. Neck supple.  Pulmonary/Chest: Effort normal.  Musculoskeletal: Normal range of motion.  Left knee with medial contusion and ttp, full arom  Neurological: She is alert and oriented to person, place, and time. She exhibits normal muscle tone. Coordination normal.  Skin: Skin is warm and dry.  Psychiatric: She has a normal mood and affect. Her behavior is normal. Thought content normal.  Nursing note and vitals reviewed.    ED Treatments / Results  DIAGNOSTIC STUDIES: Oxygen Saturation  is 100% on RA, normal by my interpretation.  COORDINATION OF CARE: 12:34 PM-Discussed treatment plan with pt at bedside and pt agreed to plan.   Labs (all labs ordered are listed, but only abnormal results are displayed) Labs Reviewed - No data to display  EKG  EKG Interpretation None       Radiology No results found.  Procedures Procedures (including critical care time)  Medications Ordered in ED Medications - No data to display   Initial Impression / Assessment and Plan / ED Course  I have reviewed the triage vital signs and the nursing notes.  Pertinent labs & imaging results that were available during my care of the patient were reviewed by me and considered in my medical decision making (see  chart for details).     Plan knee immobilizer, cold therapy, crutches, f/u if not better in 2-4 days.  Final Clinical Impressions(s) / ED Diagnoses   Final diagnoses:  Contusion of left knee, initial encounter    New Prescriptions New Prescriptions   No medications on file  I personally performed the services described in this documentation, which was scribed in my presence. The recorded information has been reviewed and considered.    Margarita Grizzle, MD 08/21/16 [phone]

## 2016-08-21 NOTE — ED Notes (Signed)
Pt verbalized understanding discharge instructions and denies any further needs or questions at this time. VS stable, ambulatory and steady gait.   

## 2016-08-21 NOTE — ED Triage Notes (Signed)
Pt. Stated, I tripped over a car stopper last night and hurt my left knee

## 2018-03-07 NOTE — L&D Delivery Note (Signed)
Delivery Note At 2003 a viable female infant was delivered via SVD, presentation: OA. APGAR: 9, 9; weight pending.   Placenta status: spontaneously delivered intact with gentle cord traction. Fundus firm with massage and Pitocin.   Anesthesia: epidural Lacerations: none Est. Blood Loss (mL): 100 Placenta to LD Complications nuchal x1, reduced Cord ph n/a   Mom to postpartum. Baby to Couplet care / Skin to Skin.    Julianne Handler, [place] 12/04/2018 8:18 PM

## 2018-06-19 ENCOUNTER — Other Ambulatory Visit (HOSPITAL_COMMUNITY): Payer: Self-pay | Admitting: *Deleted

## 2018-06-19 ENCOUNTER — Encounter (HOSPITAL_COMMUNITY): Payer: Self-pay | Admitting: *Deleted

## 2018-06-19 DIAGNOSIS — Z369 Encounter for antenatal screening, unspecified: Principal | ICD-10-CM

## 2018-06-19 NOTE — Progress Notes (Unsigned)
Us/

## 2018-06-21 ENCOUNTER — Other Ambulatory Visit: Payer: Self-pay

## 2018-06-21 ENCOUNTER — Ambulatory Visit (HOSPITAL_COMMUNITY): Payer: Medicaid Other

## 2018-06-21 ENCOUNTER — Ambulatory Visit (HOSPITAL_COMMUNITY)
Admission: RE | Admit: 2018-06-21 | Discharge: 2018-06-21 | Disposition: A | Discharge status: Home / Self Care | Payer: Medicaid Other | Source: Ambulatory Visit | Attending: Obstetrics and Gynecology | Admitting: Obstetrics and Gynecology

## 2018-06-21 ENCOUNTER — Ambulatory Visit (HOSPITAL_COMMUNITY): Payer: Medicaid Other | Admitting: *Deleted

## 2018-06-21 ENCOUNTER — Encounter (HOSPITAL_COMMUNITY): Payer: Self-pay

## 2018-06-21 VITALS — Temp 98.4°F

## 2018-06-21 DIAGNOSIS — Z3682 Encounter for antenatal screening for nuchal translucency: Principal | ICD-10-CM

## 2018-06-21 DIAGNOSIS — Z369 Encounter for antenatal screening, unspecified: Principal | ICD-10-CM

## 2018-06-21 DIAGNOSIS — Z3A13 13 weeks gestation of pregnancy: Secondary | ICD-10-CM

## 2018-06-21 DIAGNOSIS — O99331 Smoking (tobacco) complicating pregnancy, first trimester: Secondary | ICD-10-CM

## 2018-06-28 ENCOUNTER — Other Ambulatory Visit (HOSPITAL_COMMUNITY): Payer: Self-pay | Admitting: Obstetrics and Gynecology

## 2018-11-28 ENCOUNTER — Encounter (HOSPITAL_COMMUNITY): Payer: Self-pay

## 2018-11-28 ENCOUNTER — Other Ambulatory Visit: Payer: Self-pay | Admitting: Advanced Practice Midwife

## 2018-11-28 ENCOUNTER — Other Ambulatory Visit: Payer: Self-pay | Admitting: Certified Nurse Midwife

## 2018-11-28 ENCOUNTER — Other Ambulatory Visit: Payer: Self-pay

## 2018-11-28 ENCOUNTER — Inpatient Hospital Stay (HOSPITAL_COMMUNITY)
Admission: AD | Admit: 2018-11-28 | Discharge: 2018-11-28 | Disposition: A | Discharge status: Home / Self Care | Payer: Medicaid Other | Attending: Obstetrics and Gynecology | Admitting: Obstetrics and Gynecology

## 2018-11-28 DIAGNOSIS — O1493 Unspecified pre-eclampsia, third trimester: Secondary | ICD-10-CM

## 2018-11-28 DIAGNOSIS — Z3A36 36 weeks gestation of pregnancy: Secondary | ICD-10-CM | POA: Diagnosis not present

## 2018-11-28 DIAGNOSIS — Z3689 Encounter for other specified antenatal screening: Secondary | ICD-10-CM

## 2018-11-28 DIAGNOSIS — O163 Unspecified maternal hypertension, third trimester: Secondary | ICD-10-CM | POA: Insufficient documentation

## 2018-11-28 LAB — CBC
HCT: 37.4 % (ref 36.0–46.0)
Hemoglobin: 12.9 g/dL (ref 12.0–15.0)
MCH: 32.8 pg (ref 26.0–34.0)
MCHC: 34.5 g/dL (ref 30.0–36.0)
MCV: 95.2 fL (ref 80.0–100.0)
Platelets: 180 10*3/uL (ref 150–400)
RBC: 3.93 MIL/uL (ref 3.87–5.11)
RDW: 14 % (ref 11.5–15.5)
WBC: 10.5 10*3/uL (ref 4.0–10.5)
nRBC: 0 % (ref 0.0–0.2)

## 2018-11-28 LAB — COMPREHENSIVE METABOLIC PANEL
ALT: 23 U/L (ref 0–44)
AST: 28 U/L (ref 15–41)
Albumin: 2.7 g/dL — ABNORMAL LOW (ref 3.5–5.0)
Alkaline Phosphatase: 204 U/L — ABNORMAL HIGH (ref 38–126)
Anion gap: 11 (ref 5–15)
BUN: <5 mg/dL — ABNORMAL LOW (ref 6–20)
CO2: 19 mmol/L — ABNORMAL LOW (ref 22–32)
Calcium: 8.6 mg/dL — ABNORMAL LOW (ref 8.9–10.3)
Chloride: 106 mmol/L (ref 98–111)
Creatinine, Ser: 0.64 mg/dL (ref 0.44–1.00)
GFR calc Af Amer: >60 mL/min (ref >60)
GFR calc non Af Amer: >60 mL/min (ref >60)
Glucose, Bld: 122 mg/dL — ABNORMAL HIGH (ref 70–99)
Potassium: 3 mmol/L — ABNORMAL LOW (ref 3.5–5.1)
Sodium: 136 mmol/L (ref 135–145)
Total Bilirubin: 0.3 mg/dL (ref 0.3–1.2)
Total Protein: 5.8 g/dL — ABNORMAL LOW (ref 6.5–8.1)

## 2018-11-28 LAB — PROTEIN / CREATININE RATIO, URINE
Creatinine, Urine: 59.08 mg/dL
Protein Creatinine Ratio: 0.56 mg/mg{Cre} — ABNORMAL HIGH (ref 0.00–0.15)
Total Protein, Urine: 33 mg/dL

## 2018-11-28 NOTE — MAU Note (Signed)
Sent from dr's office.  BP was high last wk, high again today. Denies HA, visual changes, epigastric pain or change in swelling.  Ankles and hands swell off and on .

## 2018-11-28 NOTE — MAU Provider Note (Signed)
Chief Complaint  Patient presents with  . Hypertension     First Provider Initiated Contact with Patient 11/28/18 1615      S: Jennifer Elliott  is a 28 y.o. y.o. year old G31P1001 female at [redacted]w[redacted]d weeks gestation who presents to MAU with elevated blood pressures. Denies Hx of hypertension and not currently on any medication for BP. She receives prenatal care at [facility]- last seen today and obtained GBS today in the office.   She denies HA, vision changes or RUQ pain.  Contractions: denies  Vaginal bleeding: denies  Fetal movement: present   O:  Patient Vitals for the past 24 hrs:  BP Temp Temp src Pulse Resp SpO2 Weight  11/28/18 1630 (!) 147/92 - - 82 - - -  11/28/18 1620 - - - - - 99 % -  11/28/18 1615 (!) 144/87 - - 84 - 99 % -  11/28/18 1610 - - - - - 99 % -  11/28/18 1605 - - - - - 99 % -  11/28/18 1600 (!) 138/92 - - 86 - 98 % -  11/28/18 1554 - - - - - 100 % -  11/28/18 1550 - - - - - 99 % -  11/28/18 1545 (!) 154/84 - - 88 - 100 % -  11/28/18 1536 (!) 143/92 - - 97 - 99 % -  11/28/18 1523 139/81 98.5 F (36.9 C) Oral 74 17 98 % 69.4 kg   General: NAD Heart: Regular rate Lungs: Normal rate and effort Abd: Soft, NT, Gravid, S=D Extremities: no pedal edema Neuro: 2+ deep tendon reflexes, No clonus    FHR: 135/ moderate/ +accels/ no decelerations  Toco: no UC   Results for orders placed or performed during the hospital encounter of 11/28/18 (from the past 24 hour(s))  CBC     Status: None   Collection Time: 11/28/18  3:13 PM  Result Value Ref Range   WBC 10.5 4.0 - 10.5 K/uL   RBC 3.93 3.87 - 5.11 MIL/uL   Hemoglobin 12.9 12.0 - 15.0 g/dL   HCT 27.7 82.4 - 23.5 %   MCV 95.2 80.0 - 100.0 fL   MCH 32.8 26.0 - 34.0 pg   MCHC 34.5 30.0 - 36.0 g/dL   RDW 36.1 44.3 - 15.4 %   Platelets 180 150 - 400 K/uL   nRBC 0.0 0.0 - 0.2 %  Comprehensive metabolic panel     Status: Abnormal   Collection Time: 11/28/18  3:13 PM  Result Value Ref Range   Sodium 136 135 - 145  mmol/L   Potassium 3.0 (L) 3.5 - 5.1 mmol/L   Chloride 106 98 - 111 mmol/L   CO2 19 (L) 22 - 32 mmol/L   Glucose, Bld 122 (H) 70 - 99 mg/dL   BUN <5 (L) 6 - 20 mg/dL   Creatinine, Ser 0.08 0.44 - 1.00 mg/dL   Calcium 8.6 (L) 8.9 - 10.3 mg/dL   Total Protein 5.8 (L) 6.5 - 8.1 g/dL   Albumin 2.7 (L) 3.5 - 5.0 g/dL   AST 28 15 - 41 U/L   ALT 23 0 - 44 U/L   Alkaline Phosphatase 204 (H) 38 - 126 U/L   Total Bilirubin 0.3 0.3 - 1.2 mg/dL   GFR calc non Af Amer >60 >60 mL/min   GFR calc Af Amer >60 >60 mL/min   Anion gap 11 5 - 15  Protein / creatinine ratio, urine     Status: Abnormal  Collection Time: 11/28/18  3:28 PM  Result Value Ref Range   Creatinine, Urine 59.08 mg/dL   Total Protein, Urine 33 mg/dL   Protein Creatinine Ratio 0.56 (H) 0.00 - 0.15 mg/mg[Cre]   Educated and discussed results of lab work with patient. Diagnosed with PEC based on PCR completed today, strict PEC precautions discussed with patient and reasons to present back to MAU.   A: [redacted]w[redacted]d week IUP Preeclampsia FHR reactive  P: Discharge home in stable condition Scheduled for IOL on 12/04/2018- orders for admission placed  Preeclampsia precautions Follow-up for blood pressure check in 2 days at your doctor's office sooner as needed if symptoms worsen.  Return to maternity admissions as needed in emergencies  Lajean Manes, [place] 11/28/2018 4:49 PM

## 2018-11-29 ENCOUNTER — Encounter (HOSPITAL_COMMUNITY): Payer: Self-pay

## 2018-11-29 ENCOUNTER — Encounter (HOSPITAL_COMMUNITY): Payer: Self-pay | Admitting: Emergency Medicine

## 2018-11-30 ENCOUNTER — Telehealth (HOSPITAL_COMMUNITY): Payer: Self-pay | Admitting: *Deleted

## 2018-11-30 ENCOUNTER — Encounter (HOSPITAL_COMMUNITY): Payer: Self-pay | Admitting: *Deleted

## 2018-11-30 NOTE — Telephone Encounter (Signed)
Preadmission screen  

## 2018-12-01 ENCOUNTER — Other Ambulatory Visit: Payer: Self-pay

## 2018-12-01 ENCOUNTER — Inpatient Hospital Stay (HOSPITAL_COMMUNITY)
Admission: AD | Admit: 2018-12-01 | Discharge: 2018-12-01 | Disposition: A | Discharge status: Home / Self Care | Payer: Medicaid Other | Attending: Obstetrics & Gynecology | Admitting: Obstetrics & Gynecology

## 2018-12-01 ENCOUNTER — Encounter (HOSPITAL_COMMUNITY): Payer: Self-pay | Admitting: *Deleted

## 2018-12-01 DIAGNOSIS — O4703 False labor before 37 completed weeks of gestation, third trimester: Secondary | ICD-10-CM | POA: Diagnosis not present

## 2018-12-01 DIAGNOSIS — Z20828 Contact with and (suspected) exposure to other viral communicable diseases: Secondary | ICD-10-CM | POA: Insufficient documentation

## 2018-12-01 DIAGNOSIS — O10913 Unspecified pre-existing hypertension complicating pregnancy, third trimester: Secondary | ICD-10-CM | POA: Diagnosis not present

## 2018-12-01 DIAGNOSIS — Z3689 Encounter for other specified antenatal screening: Secondary | ICD-10-CM | POA: Diagnosis not present

## 2018-12-01 DIAGNOSIS — O26893 Other specified pregnancy related conditions, third trimester: Secondary | ICD-10-CM | POA: Insufficient documentation

## 2018-12-01 DIAGNOSIS — O1493 Unspecified pre-eclampsia, third trimester: Secondary | ICD-10-CM

## 2018-12-01 DIAGNOSIS — Z3A36 36 weeks gestation of pregnancy: Secondary | ICD-10-CM | POA: Insufficient documentation

## 2018-12-01 DIAGNOSIS — R51 Headache: Secondary | ICD-10-CM | POA: Insufficient documentation

## 2018-12-01 LAB — CBC
HCT: 37.5 % (ref 36.0–46.0)
Hemoglobin: 12.7 g/dL (ref 12.0–15.0)
MCH: 32.4 pg (ref 26.0–34.0)
MCHC: 33.9 g/dL (ref 30.0–36.0)
MCV: 95.7 fL (ref 80.0–100.0)
Platelets: 159 10*3/uL (ref 150–400)
RBC: 3.92 MIL/uL (ref 3.87–5.11)
RDW: 13.8 % (ref 11.5–15.5)
WBC: 9 10*3/uL (ref 4.0–10.5)
nRBC: 0 % (ref 0.0–0.2)

## 2018-12-01 LAB — URINALYSIS, ROUTINE W REFLEX MICROSCOPIC
Bilirubin Urine: NEGATIVE
Glucose, UA: NEGATIVE mg/dL
Hgb urine dipstick: NEGATIVE
Ketones, ur: NEGATIVE mg/dL
Nitrite: NEGATIVE
Protein, ur: NEGATIVE mg/dL
Specific Gravity, Urine: 1.002 — ABNORMAL LOW (ref 1.005–1.030)
pH: 7 (ref 5.0–8.0)

## 2018-12-01 LAB — COMPREHENSIVE METABOLIC PANEL
ALT: 22 U/L (ref 0–44)
AST: 27 U/L (ref 15–41)
Albumin: 2.4 g/dL — ABNORMAL LOW (ref 3.5–5.0)
Alkaline Phosphatase: 177 U/L — ABNORMAL HIGH (ref 38–126)
Anion gap: 7 (ref 5–15)
BUN: <5 mg/dL — ABNORMAL LOW (ref 6–20)
CO2: 23 mmol/L (ref 22–32)
Calcium: 8.7 mg/dL — ABNORMAL LOW (ref 8.9–10.3)
Chloride: 109 mmol/L (ref 98–111)
Creatinine, Ser: 0.52 mg/dL (ref 0.44–1.00)
GFR calc Af Amer: >60 mL/min (ref >60)
GFR calc non Af Amer: >60 mL/min (ref >60)
Glucose, Bld: 91 mg/dL (ref 70–99)
Potassium: 3.5 mmol/L (ref 3.5–5.1)
Sodium: 139 mmol/L (ref 135–145)
Total Bilirubin: 0.5 mg/dL (ref 0.3–1.2)
Total Protein: 5.5 g/dL — ABNORMAL LOW (ref 6.5–8.1)

## 2018-12-01 LAB — ABO/RH: ABO/RH(D): B POS

## 2018-12-01 LAB — PROTEIN / CREATININE RATIO, URINE
Creatinine, Urine: 22.26 mg/dL
Protein Creatinine Ratio: 0.49 mg/mg{Cre} — ABNORMAL HIGH (ref 0.00–0.15)
Total Protein, Urine: 11 mg/dL

## 2018-12-01 LAB — SARS CORONAVIRUS 2 (TAT 6-24 HRS): SARS Coronavirus 2: NEGATIVE

## 2018-12-01 MED ORDER — ACETAMINOPHEN 500 MG PO TABS
1000.0000 mg | ORAL_TABLET | Freq: Once | ORAL | Status: AC
  Administered 2018-12-01: 11:00:00 1000 mg via ORAL
  Filled 2018-12-01: qty 2

## 2018-12-01 NOTE — MAU Note (Signed)
Jennifer Elliott is a 28 y.o. at [redacted]w[redacted]d here in MAU reporting: woke up this AM with lower abdominal pain and a headache. No visual changes or epigastric pain. No bleeding or LOF. Thinks her mucus plug came out.   Onset of complaint: 0700 this morning  Pain score:  Headache 6/10, abdominal pain 7/10  Vitals:   12/01/18 0958 12/01/18 1018  BP: (!) 162/87 (!) 154/95  Pulse:  70  Resp:  16  Temp:  98.1 F (36.7 C)  SpO2:  97%     FHT: +FM  Lab orders placed from triage: UA

## 2018-12-01 NOTE — MAU Provider Note (Signed)
Chief Complaint  Patient presents with  . Hypertension  . Headache  . Contractions     First Provider Initiated Contact with Patient 12/01/18 1101      S: Rosio Elliott  is a 28 y.o. y.o. year old G57P1001 female at [redacted]w[redacted]d weeks gestation who presents to MAU with elevated blood pressures. Diagnosed with PEC based on PCR on 9/23 and has IOL scheduled for 12/04/2018. Current blood pressure medication: none.   She reports HA started around 0900- frontal HA, rates pain 3/10- has not taken Jennifer medication for HA. She denies vision changes or RUQ pain.   Contractions: present, patient reports contractions every 7-15 minutes that she has been occurring since 0700 this morning- breathing through contractions  Vaginal bleeding: denies  Fetal movement: present   O:  Patient Vitals for the past 24 hrs:  BP Temp Temp src Pulse Resp SpO2  12/01/18 1307 - - - - 16 -  12/01/18 1245 (!) 139/92 - - 73 - -  12/01/18 1230 132/85 - - 71 - -  12/01/18 1145 137/84 - - 75 - -  12/01/18 1130 (!) 145/96 - - 75 - -  12/01/18 1115 (!) 151/91 - - 77 - -  12/01/18 1100 (!) 146/85 - - 71 - -  12/01/18 1045 (!) 151/97 - - 71 - -  12/01/18 1039 (!) 155/88 - - 77 - -  12/01/18 1018 (!) 154/95 98.1 F (36.7 C) Oral 70 16 97 %  12/01/18 0958 (!) 162/87 - - - - -   General: NAD Heart: Regular rate Lungs: Normal rate and effort Abd: Soft, NT, Gravid, S=D Extremities: no pedal edema Neuro: 2+ deep tendon reflexes, No clonus Pelvic: NEFG, no bleeding or LOF.   Dilation: 3 Effacement (%): 50 Cervical Position: Posterior Station: -3 Presentation: Vertex Exam by:: V Jennifer Elliott CNM  FHR: 135/ moderate/ +accels/ no decelerations  Toco: mild contractions every 7-10 minutes   Results for orders placed or performed during the hospital encounter of 12/01/18 (from the past 24 hour(s))  Urinalysis, Routine w reflex microscopic     Status: Abnormal   Collection Time: 12/01/18 10:36 AM  Result Value Ref Range   Color,  Urine STRAW (A) YELLOW   APPearance CLEAR CLEAR   Specific Gravity, Urine 1.002 (L) 1.005 - 1.030   pH 7.0 5.0 - 8.0   Glucose, UA NEGATIVE NEGATIVE mg/dL   Hgb urine dipstick NEGATIVE NEGATIVE   Bilirubin Urine NEGATIVE NEGATIVE   Ketones, ur NEGATIVE NEGATIVE mg/dL   Protein, ur NEGATIVE NEGATIVE mg/dL   Nitrite NEGATIVE NEGATIVE   Leukocytes,Ua TRACE (A) NEGATIVE   WBC, UA 0-5 0 - 5 WBC/hpf   Bacteria, UA RARE (A) NONE SEEN   Squamous Epithelial / LPF 0-5 0 - 5  Protein / creatinine ratio, urine     Status: Abnormal   Collection Time: 12/01/18 10:36 AM  Result Value Ref Range   Creatinine, Urine 22.26 mg/dL   Total Protein, Urine 11 mg/dL   Protein Creatinine Ratio 0.49 (H) 0.00 - 0.15 mg/mg[Cre]  Type and screen [name]     Status: None   Collection Time: 12/01/18 11:08 AM  Result Value Ref Range   ABO/RH(D) B POS    Antibody Screen NEG    Sample Expiration      12/04/2018,2359 Performed at [facility], [address] [place], [place] [address]   CBC     Status: None   Collection Time: 12/01/18 11:08 AM  Result Value Ref Range   WBC 9.0 4.0 - 10.5 K/uL   RBC 3.92 3.87 - 5.11 MIL/uL   Hemoglobin 12.7 12.0 - 15.0 g/dL   HCT 37.5 36.0 - 46.0 %   MCV 95.7 80.0 - 100.0 fL   MCH 32.4 26.0 - 34.0 pg   MCHC 33.9 30.0 - 36.0 g/dL   RDW 13.8 11.5 - 15.5 %   Platelets 159 150 - 400 K/uL   nRBC 0.0 0.0 - 0.2 %  Comprehensive metabolic panel     Status: Abnormal   Collection Time: 12/01/18 11:08 AM  Result Value Ref Range   Sodium 139 135 - 145 mmol/L   Potassium 3.5 3.5 - 5.1 mmol/L   Chloride 109 98 - 111 mmol/L   CO2 23 22 - 32 mmol/L   Glucose, Bld 91 70 - 99 mg/dL   BUN <5 (L) 6 - 20 mg/dL   Creatinine, Ser 0.52 0.44 - 1.00 mg/dL   Calcium 8.7 (L) 8.9 - 10.3 mg/dL   Total Protein 5.5 (L) 6.5 - 8.1 g/dL   Albumin 2.4 (L) 3.5 - 5.0 g/dL   AST 27 15 - 41 U/L   ALT 22 0 - 44 U/L   Alkaline Phosphatase 177 (H) 38 - 126 U/L   Total  Bilirubin 0.5 0.3 - 1.2 mg/dL   GFR calc non Af Amer >60 >60 mL/min   GFR calc Af Amer >60 >60 mL/min   Anion gap 7 5 - 15   Treatments in MAU included Tylenol 1,000mg  for HA given in MAU. Reassessment patient reports HA is resolved Rechecked cervix 1.5 hours later, no cervical change   Consult with Dr Harolyn Rutherford on assessment and management. Discussed 1 severe range BP at [facility] today before transfer but no severe ranges following. Okay with discharge home with strict precautions and to return as scheduled for IOL.   Educated and discussed plan with patient. Patient verbalizes understanding. Patient was to return tomorrow for COVID- screening done today instead. Pt stable at time of discharge.    A: [redacted]w[redacted]d week IUP Preeclampsia FHR reactive  P: Discharge home in stable condition per consult with Anyanwu, [name], Elliott. Preeclampsia precautions. Follow-up for IOL on 9/29 Return to maternity admissions as needed  Jennifer Elliott 12/01/2018 1:17 PM

## 2018-12-02 ENCOUNTER — Other Ambulatory Visit (HOSPITAL_COMMUNITY)
Admission: RE | Admit: 2018-12-02 | Discharge: 2018-12-02 | Disposition: A | Discharge status: Home / Self Care | Payer: Medicaid Other | Source: Ambulatory Visit | Attending: Obstetrics & Gynecology | Admitting: Obstetrics & Gynecology

## 2018-12-03 ENCOUNTER — Inpatient Hospital Stay (EMERGENCY_DEPARTMENT_HOSPITAL)
Admission: AD | Admit: 2018-12-03 | Discharge: 2018-12-03 | Disposition: A | Discharge status: Home / Self Care | Payer: Medicaid Other | Source: Home / Self Care | Attending: Obstetrics & Gynecology | Admitting: Obstetrics & Gynecology

## 2018-12-03 ENCOUNTER — Encounter (HOSPITAL_COMMUNITY): Payer: Self-pay | Admitting: *Deleted

## 2018-12-03 ENCOUNTER — Other Ambulatory Visit: Payer: Self-pay

## 2018-12-03 DIAGNOSIS — O4703 False labor before 37 completed weeks of gestation, third trimester: Secondary | ICD-10-CM

## 2018-12-03 DIAGNOSIS — Z3A36 36 weeks gestation of pregnancy: Secondary | ICD-10-CM

## 2018-12-03 DIAGNOSIS — O99333 Smoking (tobacco) complicating pregnancy, third trimester: Secondary | ICD-10-CM | POA: Insufficient documentation

## 2018-12-03 DIAGNOSIS — O1403 Mild to moderate pre-eclampsia, third trimester: Secondary | ICD-10-CM | POA: Insufficient documentation

## 2018-12-03 DIAGNOSIS — O133 Gestational [pregnancy-induced] hypertension without significant proteinuria, third trimester: Secondary | ICD-10-CM | POA: Insufficient documentation

## 2018-12-03 DIAGNOSIS — F17210 Nicotine dependence, cigarettes, uncomplicated: Secondary | ICD-10-CM | POA: Insufficient documentation

## 2018-12-03 DIAGNOSIS — O479 False labor, unspecified: Secondary | ICD-10-CM

## 2018-12-03 LAB — COMPREHENSIVE METABOLIC PANEL
ALT: 20 U/L (ref 0–44)
AST: 24 U/L (ref 15–41)
Albumin: 2.6 g/dL — ABNORMAL LOW (ref 3.5–5.0)
Alkaline Phosphatase: 201 U/L — ABNORMAL HIGH (ref 38–126)
Anion gap: 10 (ref 5–15)
BUN: <5 mg/dL — ABNORMAL LOW (ref 6–20)
CO2: 21 mmol/L — ABNORMAL LOW (ref 22–32)
Calcium: 8.4 mg/dL — ABNORMAL LOW (ref 8.9–10.3)
Chloride: 107 mmol/L (ref 98–111)
Creatinine, Ser: 0.54 mg/dL (ref 0.44–1.00)
GFR calc Af Amer: >60 mL/min (ref >60)
GFR calc non Af Amer: >60 mL/min (ref >60)
Glucose, Bld: 86 mg/dL (ref 70–99)
Potassium: 3.3 mmol/L — ABNORMAL LOW (ref 3.5–5.1)
Sodium: 138 mmol/L (ref 135–145)
Total Bilirubin: 0.4 mg/dL (ref 0.3–1.2)
Total Protein: 5.7 g/dL — ABNORMAL LOW (ref 6.5–8.1)

## 2018-12-03 LAB — PROTEIN / CREATININE RATIO, URINE
Creatinine, Urine: 34.27 mg/dL
Protein Creatinine Ratio: 0.44 mg/mg{Cre} — ABNORMAL HIGH (ref 0.00–0.15)
Total Protein, Urine: 15 mg/dL

## 2018-12-03 LAB — URINALYSIS, ROUTINE W REFLEX MICROSCOPIC
Bilirubin Urine: NEGATIVE
Glucose, UA: NEGATIVE mg/dL
Hgb urine dipstick: NEGATIVE
Ketones, ur: NEGATIVE mg/dL
Leukocytes,Ua: NEGATIVE
Nitrite: NEGATIVE
Protein, ur: NEGATIVE mg/dL
Specific Gravity, Urine: 1.004 — ABNORMAL LOW (ref 1.005–1.030)
pH: 7 (ref 5.0–8.0)

## 2018-12-03 LAB — CBC
HCT: 35.8 % — ABNORMAL LOW (ref 36.0–46.0)
Hemoglobin: 12.6 g/dL (ref 12.0–15.0)
MCH: 33.4 pg (ref 26.0–34.0)
MCHC: 35.2 g/dL (ref 30.0–36.0)
MCV: 95 fL (ref 80.0–100.0)
Platelets: 158 10*3/uL (ref 150–400)
RBC: 3.77 MIL/uL — ABNORMAL LOW (ref 3.87–5.11)
RDW: 13.8 % (ref 11.5–15.5)
WBC: 10.2 10*3/uL (ref 4.0–10.5)
nRBC: 0 % (ref 0.0–0.2)

## 2018-12-03 LAB — TYPE AND SCREEN
ABO/RH(D): B POS
ABO/RH(D): B POS
Antibody Screen: NEGATIVE
Antibody Screen: NEGATIVE

## 2018-12-03 MED ORDER — PROMETHAZINE HCL 25 MG/ML IJ SOLN
12.5000 mg | Freq: Four times a day (QID) | INTRAMUSCULAR | Status: DC | PRN
  Administered 2018-12-03: 12.5 mg via INTRAMUSCULAR
  Filled 2018-12-03: qty 1

## 2018-12-03 MED ORDER — BUTORPHANOL TARTRATE 1 MG/ML IJ SOLN
1.0000 mg | Freq: Once | INTRAMUSCULAR | Status: AC
  Administered 2018-12-03: 14:00:00 1 mg via INTRAMUSCULAR
  Filled 2018-12-03: qty 1

## 2018-12-03 NOTE — MAU Note (Signed)
Presents with c/o elevated BP after evaluating @ home, reports BP 202/104 this morning.  Denies current H/A, visual disturbances, or epigastric pain, but reports H/A earlier this morning.  Also reports having ctxs every 10-15 minutes apart that began this morning @ 0700.  Denies VB, unsure if LOF.  Endorses +FM.

## 2018-12-03 NOTE — MAU Provider Note (Signed)
History     CSN: [id number]  Arrival date and time: 12/03/18 6295   First Provider Initiated Contact with Patient 12/03/18 [phone]      Chief Complaint  Patient presents with  . Contractions  . BP Evaluation   HPI   [name] is a 28 y.o. female G2P1001 @ [redacted]w[redacted]d here with contractions and elevated BP. She has a diagnoses of Preeclampsia and is scheduled for an induction tomorrow at 0700. Contractions started this morning and says they have become stronger throughout the morning. She denies headache or changes in her visit. + fetal movement. No bleeding.   OB History    Gravida  2   Para  1   Term  1   Preterm  0   AB  0   Living  1     SAB  0   TAB  0   Ectopic  0   Multiple      Live Births  1           Past Medical History:  Diagnosis Date  . Anemia   . Anxiety   . Depression    doing good  . Medical history non-contributory   . Pregnancy induced hypertension     Past Surgical History:  Procedure Laterality Date  . NO PAST SURGERIES      Family History  Problem Relation Age of Onset  . Other Neg Hx     Social History   Tobacco Use  . Smoking status: Current Some Day Smoker    Years: 0.50    Types: Cigarettes  . Smokeless tobacco: Never Used  . Tobacco comment: 1-2 cigarettes every few days  Substance Use Topics  . Alcohol use: Not Currently  . Drug use: Yes    Frequency: 2.0 times per week    Types: Marijuana    Comment: last smoked 11/30/2018    Allergies: No Known Allergies  No medications prior to admission.   Results for orders placed or performed during the hospital encounter of 12/03/18 (from the past 48 hour(s))  Urinalysis, Routine w reflex microscopic     Status: Abnormal   Collection Time: 12/03/18  9:47 AM  Result Value Ref Range   Color, Urine STRAW (A) YELLOW   APPearance CLEAR CLEAR   Specific Gravity, Urine 1.004 (L) 1.005 - 1.030   pH 7.0 5.0 - 8.0   Glucose, UA NEGATIVE NEGATIVE mg/dL   Hgb urine  dipstick NEGATIVE NEGATIVE   Bilirubin Urine NEGATIVE NEGATIVE   Ketones, ur NEGATIVE NEGATIVE mg/dL   Protein, ur NEGATIVE NEGATIVE mg/dL   Nitrite NEGATIVE NEGATIVE   Leukocytes,Ua NEGATIVE NEGATIVE    Comment: Performed at [facility] [address], [place], [place] [address]  CBC     Status: Abnormal   Collection Time: 12/03/18  9:49 AM  Result Value Ref Range   WBC 10.2 4.0 - 10.5 K/uL   RBC 3.77 (L) 3.87 - 5.11 MIL/uL   Hemoglobin 12.6 12.0 - 15.0 g/dL   HCT 35.8 (L) 36.0 - 46.0 %   MCV 95.0 80.0 - 100.0 fL   MCH 33.4 26.0 - 34.0 pg   MCHC 35.2 30.0 - 36.0 g/dL   RDW 13.8 11.5 - 15.5 %   Platelets 158 150 - 400 K/uL    Comment: REPEATED TO VERIFY   nRBC 0.0 0.0 - 0.2 %    Comment: Performed at [facility], [place] [address] [place], [place] [address]  Comprehensive  metabolic panel     Status: Abnormal   Collection Time: 12/03/18  9:49 AM  Result Value Ref Range   Sodium 138 135 - 145 mmol/L   Potassium 3.3 (L) 3.5 - 5.1 mmol/L   Chloride 107 98 - 111 mmol/L   CO2 21 (L) 22 - 32 mmol/L   Glucose, Bld 86 70 - 99 mg/dL   BUN <5 (L) 6 - 20 mg/dL   Creatinine, Ser 4.94 0.44 - 1.00 mg/dL   Calcium 8.4 (L) 8.9 - 10.3 mg/dL   Total Protein 5.7 (L) 6.5 - 8.1 g/dL   Albumin 2.6 (L) 3.5 - 5.0 g/dL   AST 24 15 - 41 U/L   ALT 20 0 - 44 U/L   Alkaline Phosphatase 201 (H) 38 - 126 U/L   Total Bilirubin 0.4 0.3 - 1.2 mg/dL   GFR calc non Af Amer >60 >60 mL/min   GFR calc Af Amer >60 >60 mL/min   Anion gap 10 5 - 15    Comment: Performed at [facility], [address], [place], [place] [address]  Protein / creatinine ratio, urine     Status: Abnormal   Collection Time: 12/03/18  9:49 AM  Result Value Ref Range   Creatinine, Urine 34.27 mg/dL   Total Protein, Urine 15 mg/dL    Comment: NO NORMAL RANGE ESTABLISHED FOR THIS TEST   Protein Creatinine Ratio 0.44 (H) 0.00 - 0.15 mg/mg[Cre]    Comment: Performed at [facility], [address],  [place], [place] [address]  Type and screen [facility]     Status: None   Collection Time: 12/03/18 10:18 AM  Result Value Ref Range   ABO/RH(D) B POS    Antibody Screen NEG    Sample Expiration      12/06/2018,2359 Performed at [facility], [address] [place], [place] [address]    Review of Systems  Gastrointestinal: Positive for abdominal pain.  Genitourinary: Negative for vaginal bleeding.   Physical Exam   Blood pressure (!) 154/91, pulse 75, temperature (!) 97.5 F (36.4 C), temperature source Oral, resp. rate 18, last menstrual period 03/20/2018, SpO2 99 %, unknown if currently breastfeeding.   Patient Vitals for the past 24 hrs:  BP Temp Temp src Pulse Resp SpO2  12/03/18 1313 - (!) 97.5 F (36.4 C) Oral - 18 -  12/03/18 1310 - - - - - 99 %  12/03/18 1300 (!) 154/91 - - 75 - 99 %  12/03/18 1246 (!) 148/86 - - 73 - -  12/03/18 1231 (!) 148/87 - - 76 - -  12/03/18 1230 - - - - - 99 %  12/03/18 1225 - - - - - 99 %  12/03/18 1216 (!) 151/83 - - 84 - -  12/03/18 1204 (!) 150/90 - - 82 - -  12/03/18 1200 - - - - - 99 %  12/03/18 1146 (!) 140/105 - - 89 - -  12/03/18 1131 130/66 - - 75 - -  12/03/18 1130 - - - - - 98 %  12/03/18 1116 (!) 143/92 - - 75 - -  12/03/18 1108 - - - - - 97 %  12/03/18 1101 (!) 144/89 - - 75 - -  12/03/18 1046 (!) 146/95 - - 80 - -  12/03/18 1045 - - - - - 99 %  12/03/18 1030 139/87 - - 76 - 99 %  12/03/18 1016 (!) 139/103 - - 79 - -  12/03/18 1000 (!) 147/83 - - 83 - 100 %  12/03/18 0946 (!) 147/93 - - 78 - -  12/03/18 0932 (!) 145/91 - - 78 - -  12/03/18 0930 - - - - - 100 %  12/03/18 0920 - - - - - 100 %  12/03/18 0918 (!) 151/99 98.2 F (36.8 C) Oral 84 20 -   Physical Exam  Constitutional: She is oriented to person, place, and time. She appears well-developed and well-nourished. No distress.  HENT:  Head: Normocephalic.  GI: Soft. She exhibits no distension. There is no abdominal tenderness. There is no  rebound.  Genitourinary:    Genitourinary Comments: Dilation: 3 Effacement (%): 50 Cervical Position: Posterior Station: -3 Presentation: Vertex Exam by:: Doreatha MassedF. Morris, RNC   Musculoskeletal: Normal range of motion.  Neurological: She is alert and oriented to person, place, and time. She has normal reflexes. She displays normal reflexes.  Skin: Skin is warm. She is not diaphoretic.  Psychiatric: Her behavior is normal.   Fetal Tracing: Baseline: 140 bpm Variability: Moderate  Accelerations: 15x15 Decelerations: None Toco: Occasional   MAU Course  Procedures  None  MDM  BP's not severe range. She is asymptomatic. Discussed patient with Dr. Jolayne Pantheronstant. Her cervix has been 3 cm for a week or longer. Contractions are not stronger here.   Assessment and Plan   A:  1. [redacted] weeks gestation of pregnancy   2. Mild pre-eclampsia in third trimester   3. Braxton Hicks contractions     P:  Discharge home with strict return precautions Induction scheduled for tomorrow. She should expect a call around 0700 Return to MAU if symptoms worsen Labor precautions PreE precautions  , Harolyn RutherfordJennifer I, NP 12/03/2018 7:20 PM

## 2018-12-03 NOTE — Discharge Instructions (Signed)
Preeclampsia and Eclampsia °Preeclampsia is a serious condition that may develop during pregnancy. This condition causes high blood pressure and increased protein in your urine along with other symptoms, such as headaches and vision changes. These symptoms may develop as the condition gets worse. Preeclampsia may occur at 20 weeks of pregnancy or later. °Diagnosing and treating preeclampsia early is very important. If not treated early, it can cause serious problems for you and your baby. One problem it can lead to is eclampsia. Eclampsia is a condition that causes muscle jerking or shaking (convulsions or seizures) and other serious problems for the mother. During pregnancy, delivering your baby may be the best treatment for preeclampsia or eclampsia. For most women, preeclampsia and eclampsia symptoms go away after giving birth. °In rare cases, a woman may develop preeclampsia after giving birth (postpartum preeclampsia). This usually occurs within 48 hours after childbirth but may occur up to 6 weeks after giving birth. °What are the causes? °The cause of preeclampsia is not known. °What increases the risk? °The following risk factors make you more likely to develop preeclampsia: °· Being pregnant for the first time. °· Having had preeclampsia during a past pregnancy. °· Having a family history of preeclampsia. °· Having high blood pressure. °· Being pregnant with more than one baby. °· Being 35 or older. °· Being African-American. °· Having kidney disease or diabetes. °· Having medical conditions such as lupus or blood diseases. °· Being very overweight (obese). °What are the signs or symptoms? °The most common symptoms are: °· Severe headaches. °· Vision problems, such as blurred or double vision. °· Abdominal pain, especially upper abdominal pain. °Other symptoms that may develop as the condition gets worse include: °· Sudden weight gain. °· Sudden swelling of the hands, face, legs, and feet. °· Severe nausea  and vomiting. °· Numbness in the face, arms, legs, and feet. °· Dizziness. °· Urinating less than usual. °· Slurred speech. °· Convulsions or seizures. °How is this diagnosed? °There are no screening tests for preeclampsia. Your health care provider will ask you about symptoms and check for signs of preeclampsia during your prenatal visits. You may also have tests that include: °· Checking your blood pressure. °· Urine tests to check for protein. Your health care provider will check for this at every prenatal visit. °· Blood tests. °· Monitoring your baby's heart rate. °· Ultrasound. °How is this treated? °You and your health care provider will determine the treatment approach that is best for you. Treatment may include: °· Having more frequent prenatal exams to check for signs of preeclampsia, if you have an increased risk for preeclampsia. °· Medicine to lower your blood pressure. °· Staying in the hospital, if your condition is severe. There, treatment will focus on controlling your blood pressure and the amount of fluids in your body (fluid retention). °· Taking medicine (magnesium sulfate) to prevent seizures. This may be given as an injection or through an IV. °· Taking a low-dose aspirin during your pregnancy. °· Delivering your baby early. You may have your labor started with medicine (induced), or you may have a cesarean delivery. °Follow these instructions at home: °Eating and drinking ° °· Drink enough fluid to keep your urine pale yellow. °· Avoid caffeine. °Lifestyle °· Do not use any products that contain nicotine or tobacco, such as cigarettes and e-cigarettes. If you need help quitting, ask your health care provider. °· Do not use alcohol or drugs. °· Avoid stress as much as possible. Rest and get   plenty of sleep. °General instructions °· Take over-the-counter and prescription medicines only as told by your health care provider. °· When lying down, lie on your left side. This keeps pressure off your  major blood vessels. °· When sitting or lying down, raise (elevate) your feet. Try putting some pillows underneath your lower legs. °· Exercise regularly. Ask your health care provider what kinds of exercise are best for you. °· Keep all follow-up and prenatal visits as told by your health care provider. This is important. °How is this prevented? °There is no known way of preventing preeclampsia or eclampsia from developing. However, to lower your risk of complications and detect problems early: °· Get regular prenatal care. Your health care provider may be able to diagnose and treat the condition early. °· Maintain a healthy weight. Ask your health care provider for help managing weight gain during pregnancy. °· Work with your health care provider to manage any long-term (chronic) health conditions you have, such as diabetes or kidney problems. °· You may have tests of your blood pressure and kidney function after giving birth. °· Your health care provider may have you take low-dose aspirin during your next pregnancy. °Contact a health care provider if: °· You have symptoms that your health care provider told you may require more treatment or monitoring, such as: °? Headaches. °? Nausea or vomiting. °? Abdominal pain. °? Dizziness. °? Light-headedness. °Get help right away if: °· You have severe: °? Abdominal pain. °? Headaches that do not get better. °? Dizziness. °? Vision problems. °? Confusion. °? Nausea or vomiting. °· You have any of the following: °? A seizure. °? Sudden, rapid weight gain. °? Sudden swelling in your hands, ankles, or face. °? Trouble moving any part of your body. °? Numbness in any part of your body. °? Trouble speaking. °? Abnormal bleeding. °· You faint. °Summary °· Preeclampsia is a serious condition that may develop during pregnancy. °· This condition causes high blood pressure and increased protein in your urine along with other symptoms, such as headaches and vision  changes. °· Diagnosing and treating preeclampsia early is very important. If not treated early, it can cause serious problems for you and your baby. °· Get help right away if you have symptoms that your health care provider told you to watch for. °This information is not intended to replace advice given to you by your health care provider. Make sure you discuss any questions you have with your health care provider. °Document Released: 02/19/2000 Document Revised: 10/24/2017 Document Reviewed: 09/28/2015 °Elsevier Patient Education © 2020 [employer] ° °

## 2018-12-04 ENCOUNTER — Inpatient Hospital Stay (HOSPITAL_COMMUNITY)
Admission: AD | Admit: 2018-12-04 | Discharge: 2018-12-06 | DRG: 807 | Disposition: A | Discharge status: Home / Self Care | Payer: Medicaid Other | Attending: Family Medicine | Admitting: Family Medicine

## 2018-12-04 ENCOUNTER — Encounter (HOSPITAL_COMMUNITY): Payer: Self-pay

## 2018-12-04 ENCOUNTER — Inpatient Hospital Stay (HOSPITAL_COMMUNITY): Payer: Medicaid Other | Admitting: Anesthesiology

## 2018-12-04 ENCOUNTER — Inpatient Hospital Stay (HOSPITAL_COMMUNITY): Payer: Medicaid Other

## 2018-12-04 DIAGNOSIS — O99334 Smoking (tobacco) complicating childbirth: Secondary | ICD-10-CM | POA: Diagnosis present

## 2018-12-04 DIAGNOSIS — E876 Hypokalemia: Secondary | ICD-10-CM | POA: Diagnosis present

## 2018-12-04 DIAGNOSIS — O1404 Mild to moderate pre-eclampsia, complicating childbirth: Principal | ICD-10-CM | POA: Diagnosis present

## 2018-12-04 DIAGNOSIS — F17210 Nicotine dependence, cigarettes, uncomplicated: Secondary | ICD-10-CM | POA: Diagnosis present

## 2018-12-04 DIAGNOSIS — O99324 Drug use complicating childbirth: Secondary | ICD-10-CM | POA: Diagnosis present

## 2018-12-04 DIAGNOSIS — Z3A36 36 weeks gestation of pregnancy: Secondary | ICD-10-CM | POA: Diagnosis not present

## 2018-12-04 DIAGNOSIS — O99824 Streptococcus B carrier state complicating childbirth: Secondary | ICD-10-CM | POA: Diagnosis present

## 2018-12-04 DIAGNOSIS — O99284 Endocrine, nutritional and metabolic diseases complicating childbirth: Secondary | ICD-10-CM | POA: Diagnosis present

## 2018-12-04 DIAGNOSIS — F1290 Cannabis use, unspecified, uncomplicated: Secondary | ICD-10-CM | POA: Diagnosis present

## 2018-12-04 DIAGNOSIS — Z23 Encounter for immunization: Secondary | ICD-10-CM | POA: Diagnosis not present

## 2018-12-04 DIAGNOSIS — O4703 False labor before 37 completed weeks of gestation, third trimester: Secondary | ICD-10-CM | POA: Diagnosis not present

## 2018-12-04 DIAGNOSIS — Z3A37 37 weeks gestation of pregnancy: Secondary | ICD-10-CM

## 2018-12-04 DIAGNOSIS — O1493 Unspecified pre-eclampsia, third trimester: Secondary | ICD-10-CM | POA: Diagnosis present

## 2018-12-04 DIAGNOSIS — O1403 Mild to moderate pre-eclampsia, third trimester: Secondary | ICD-10-CM | POA: Diagnosis not present

## 2018-12-04 LAB — CBC
HCT: 36.9 % (ref 36.0–46.0)
Hemoglobin: 12.4 g/dL (ref 12.0–15.0)
MCH: 32.2 pg (ref 26.0–34.0)
MCHC: 33.6 g/dL (ref 30.0–36.0)
MCV: 95.8 fL (ref 80.0–100.0)
Platelets: 157 10*3/uL (ref 150–400)
RBC: 3.85 MIL/uL — ABNORMAL LOW (ref 3.87–5.11)
RDW: 13.8 % (ref 11.5–15.5)
WBC: 9.6 10*3/uL (ref 4.0–10.5)
nRBC: 0 % (ref 0.0–0.2)

## 2018-12-04 LAB — COMPREHENSIVE METABOLIC PANEL
ALT: 21 U/L (ref 0–44)
AST: 28 U/L (ref 15–41)
Albumin: 2.6 g/dL — ABNORMAL LOW (ref 3.5–5.0)
Alkaline Phosphatase: 180 U/L — ABNORMAL HIGH (ref 38–126)
Anion gap: 11 (ref 5–15)
BUN: 5 mg/dL — ABNORMAL LOW (ref 6–20)
CO2: 18 mmol/L — ABNORMAL LOW (ref 22–32)
Calcium: 8.2 mg/dL — ABNORMAL LOW (ref 8.9–10.3)
Chloride: 107 mmol/L (ref 98–111)
Creatinine, Ser: 0.63 mg/dL (ref 0.44–1.00)
GFR calc Af Amer: >60 mL/min (ref >60)
GFR calc non Af Amer: >60 mL/min (ref >60)
Glucose, Bld: 115 mg/dL — ABNORMAL HIGH (ref 70–99)
Potassium: 2.8 mmol/L — ABNORMAL LOW (ref 3.5–5.1)
Sodium: 136 mmol/L (ref 135–145)
Total Bilirubin: 0.4 mg/dL (ref 0.3–1.2)
Total Protein: 5.8 g/dL — ABNORMAL LOW (ref 6.5–8.1)

## 2018-12-04 LAB — RPR: RPR Ser Ql: NONREACTIVE — AB

## 2018-12-04 MED ORDER — ONDANSETRON HCL 4 MG PO TABS: 4.0000 mg | ORAL_TABLET | ORAL | Status: DC | PRN

## 2018-12-04 MED ORDER — ACETAMINOPHEN 325 MG PO TABS: 650.0000 mg | ORAL_TABLET | ORAL | Status: DC | PRN

## 2018-12-04 MED ORDER — LACTATED RINGERS IV SOLN
500.0000 mL | INTRAVENOUS | Status: DC | PRN
  Administered 2018-12-04: 500 mL via INTRAVENOUS

## 2018-12-04 MED ORDER — DIBUCAINE (PERIANAL) 1 % EX OINT: 1.0000 "application " | TOPICAL_OINTMENT | CUTANEOUS | Status: DC | PRN

## 2018-12-04 MED ORDER — PHENYLEPHRINE 40 MCG/ML (10ML) SYRINGE FOR IV PUSH (FOR BLOOD PRESSURE SUPPORT): 80.0000 ug | PREFILLED_SYRINGE | INTRAVENOUS | Status: DC | PRN

## 2018-12-04 MED ORDER — OXYCODONE-ACETAMINOPHEN 5-325 MG PO TABS: 1.0000 | ORAL_TABLET | ORAL | Status: DC | PRN

## 2018-12-04 MED ORDER — TETANUS-DIPHTH-ACELL PERTUSSIS 5-2.5-18.5 LF-MCG/0.5 IM SUSP: 0.5000 mL | Freq: Once | INTRAMUSCULAR | Status: DC

## 2018-12-04 MED ORDER — LIDOCAINE HCL (PF) 1 % IJ SOLN: 30.0000 mL | INTRAMUSCULAR | Status: DC | PRN

## 2018-12-04 MED ORDER — WITCH HAZEL-GLYCERIN EX PADS: 1.0000 "application " | MEDICATED_PAD | CUTANEOUS | Status: DC | PRN

## 2018-12-04 MED ORDER — TERBUTALINE SULFATE 1 MG/ML IJ SOLN: 0.2500 mg | Freq: Once | INTRAMUSCULAR | Status: DC | PRN

## 2018-12-04 MED ORDER — PRENATAL MULTIVITAMIN CH
1.0000 | ORAL_TABLET | Freq: Every day | ORAL | Status: DC
  Administered 2018-12-05 – 2018-12-06 (×2): 1 via ORAL
  Filled 2018-12-04 (×2): qty 1

## 2018-12-04 MED ORDER — FENTANYL-BUPIVACAINE-NACL 0.5-0.125-0.9 MG/250ML-% EP SOLN: 12.0000 mL/h | EPIDURAL | Status: DC | PRN

## 2018-12-04 MED ORDER — BENZOCAINE-MENTHOL 20-0.5 % EX AERO: 1.0000 "application " | INHALATION_SPRAY | CUTANEOUS | Status: DC | PRN

## 2018-12-04 MED ORDER — MEASLES, MUMPS & RUBELLA VAC IJ SOLR: 0.5000 mL | Freq: Once | INTRAMUSCULAR | Status: DC

## 2018-12-04 MED ORDER — LIDOCAINE HCL (PF) 1 % IJ SOLN
INTRAMUSCULAR | Status: DC | PRN
  Administered 2018-12-04: 11 mL via EPIDURAL

## 2018-12-04 MED ORDER — ONDANSETRON HCL 4 MG/2ML IJ SOLN
4.0000 mg | Freq: Four times a day (QID) | INTRAMUSCULAR | Status: DC | PRN
  Administered 2018-12-04: 4 mg via INTRAVENOUS
  Filled 2018-12-04: qty 2

## 2018-12-04 MED ORDER — IBUPROFEN 600 MG PO TABS
600.0000 mg | ORAL_TABLET | Freq: Four times a day (QID) | ORAL | Status: DC
  Administered 2018-12-04 – 2018-12-06 (×5): 600 mg via ORAL
  Filled 2018-12-04 (×6): qty 1

## 2018-12-04 MED ORDER — ONDANSETRON HCL 4 MG/2ML IJ SOLN: 4.0000 mg | INTRAMUSCULAR | Status: DC | PRN

## 2018-12-04 MED ORDER — SOD CITRATE-CITRIC ACID 500-334 MG/5ML PO SOLN: 30.0000 mL | ORAL | Status: DC | PRN

## 2018-12-04 MED ORDER — SODIUM CHLORIDE (PF) 0.9 % IJ SOLN
INTRAMUSCULAR | Status: DC | PRN
  Administered 2018-12-04: 12 mL/h via EPIDURAL

## 2018-12-04 MED ORDER — LACTATED RINGERS IV SOLN
INTRAVENOUS | Status: DC
  Administered 2018-12-04: 08:00:00 via INTRAVENOUS

## 2018-12-04 MED ORDER — DIPHENHYDRAMINE HCL 25 MG PO CAPS: 25.0000 mg | ORAL_CAPSULE | Freq: Four times a day (QID) | ORAL | Status: DC | PRN

## 2018-12-04 MED ORDER — COCONUT OIL OIL: 1.0000 "application " | TOPICAL_OIL | Status: DC | PRN

## 2018-12-04 MED ORDER — PENICILLIN G 3 MILLION UNITS IVPB - SIMPLE MED
3.0000 10*6.[IU] | INTRAVENOUS | Status: DC
  Administered 2018-12-04 (×2): 3 10*6.[IU] via INTRAVENOUS
  Filled 2018-12-04 (×2): qty 100

## 2018-12-04 MED ORDER — ACETAMINOPHEN 325 MG PO TABS
650.0000 mg | ORAL_TABLET | ORAL | Status: DC | PRN
  Administered 2018-12-05 (×3): 650 mg via ORAL
  Filled 2018-12-04 (×4): qty 2

## 2018-12-04 MED ORDER — SENNOSIDES-DOCUSATE SODIUM 8.6-50 MG PO TABS
2.0000 | ORAL_TABLET | ORAL | Status: DC
  Filled 2018-12-04 (×2): qty 2

## 2018-12-04 MED ORDER — LACTATED RINGERS IV SOLN: 500.0000 mL | Freq: Once | INTRAVENOUS | Status: DC

## 2018-12-04 MED ORDER — OXYTOCIN 40 UNITS IN NORMAL SALINE INFUSION - SIMPLE MED
1.0000 m[IU]/min | INTRAVENOUS | Status: DC
  Administered 2018-12-04: 2 m[IU]/min via INTRAVENOUS

## 2018-12-04 MED ORDER — OXYTOCIN 40 UNITS IN NORMAL SALINE INFUSION - SIMPLE MED
2.5000 [IU]/h | INTRAVENOUS | Status: DC
  Filled 2018-12-04: qty 1000

## 2018-12-04 MED ORDER — FENTANYL-BUPIVACAINE-NACL 0.5-0.125-0.9 MG/250ML-% EP SOLN
EPIDURAL | Status: AC
  Filled 2018-12-04: qty 250

## 2018-12-04 MED ORDER — EPHEDRINE 5 MG/ML INJ: 10.0000 mg | INTRAVENOUS | Status: DC | PRN

## 2018-12-04 MED ORDER — SODIUM CHLORIDE 0.9 % IV SOLN
5.0000 10*6.[IU] | Freq: Once | INTRAVENOUS | Status: AC
  Administered 2018-12-04: 5 10*6.[IU] via INTRAVENOUS
  Filled 2018-12-04: qty 5

## 2018-12-04 MED ORDER — OXYCODONE-ACETAMINOPHEN 5-325 MG PO TABS: 2.0000 | ORAL_TABLET | ORAL | Status: DC | PRN

## 2018-12-04 MED ORDER — SIMETHICONE 80 MG PO CHEW: 80.0000 mg | CHEWABLE_TABLET | ORAL | Status: DC | PRN

## 2018-12-04 MED ORDER — DIPHENHYDRAMINE HCL 50 MG/ML IJ SOLN: 12.5000 mg | INTRAMUSCULAR | Status: DC | PRN

## 2018-12-04 MED ORDER — OXYTOCIN BOLUS FROM INFUSION
500.0000 mL | Freq: Once | INTRAVENOUS | Status: AC
  Administered 2018-12-04: 500 mL via INTRAVENOUS

## 2018-12-04 NOTE — Discharge Summary (Signed)
Postpartum Discharge Summary     Patient Name: Jennifer Elliott DOB: [birth year] MRN: [id number]  Date of admission: 12/04/2018 Delivering Provider: Julianne Elliott   Date of discharge: 12/06/2018  Admitting diagnosis: Pregnancy  Intrauterine pregnancy: [redacted]w[redacted]d    Secondary diagnosis:  Active Problems:   Preeclampsia, third trimester   SVD (spontaneous vaginal delivery)   Hypokalemia   Hypomagnesemia  Additional problems: Marijuana use, Nicotine use, Hx of depression/PTSD     Discharge diagnosis: Term Pregnancy Delivered and Preeclampsia (mild)                                                                                                Post partum procedures:none  Augmentation: AROM and Pitocin  Complications: None  [facility] course:  Induction of Labor With Vaginal Delivery   28y.o. yo G2P1001 at 355w0das admitted to the [facility] 12/04/2018 for induction of labor.  Indication for induction: Preeclampsia without severe features as dx by P/C ratio 0.44. Patient had an uncomplicated labor course.  Membrane Rupture Time/Date: 6:45 PM ,12/04/2018   Intrapartum Procedures: Episiotomy: None [1]                                         Lacerations:  None [1]  Patient had delivery of a Viable infant.  Information for the patient's newborn:  Jennifer, Wedekind0[[id number]]Delivery Method: Vaginal, Spontaneous(Filed from Delivery Summary)    12/04/2018  Details of delivery can be found in separate delivery note.  Patient had a postpartum course remarkable for receiving supplemental magnesium and potassium with the addition of being started on an antihypertensive on PPD#1 due to elevated BPs. She also had a SW consult [facility]  Delivery time: 8:03 PM    Magnesium Sulfate received: No BMZ received: No Rhophylac:N/A MMR:N/A Transfusion:No  Physical exam  Vitals:   12/05/18 2100 12/05/18 2234 12/06/18 0010 12/06/18 0430   BP: (!) 139/99 (!) 134/96 140/88 128/84  Pulse: 71 61 (!) 59 62  Resp: '18 18 18 18  ' Temp: 98.1 F (36.7 C) (!) 97.5 F (36.4 C) 97.9 F (36.6 C) 98 F (36.7 C)  TempSrc: Oral Oral Axillary Oral  SpO2: 99% 100% 100% 100%  Weight:      Height:       General: alert and cooperative Lochia: appropriate Uterine Fundus: firm DVT Evaluation: No evidence of DVT seen on physical exam. Labs: Lab Results  Component Value Date   WBC 9.6 12/04/2018   HGB 12.4 12/04/2018   HCT 36.9 12/04/2018   MCV 95.8 12/04/2018   PLT 157 12/04/2018   CMP Latest Ref Rng & Units 12/06/2018  Glucose 70 - 99 mg/dL 97  BUN 6 - 20 mg/dL <5(L)  Creatinine 0.44 - 1.00 mg/dL 0.68  Sodium 135 - 145 mmol/L 140  Potassium 3.5 - 5.1 mmol/L 4.1  Chloride 98 - 111 mmol/L 109  CO2 22 - 32 mmol/L 24  Calcium 8.9 -  10.3 mg/dL 9.0  Total Protein 6.5 - 8.1 g/dL 5.6(L)  Total Bilirubin 0.3 - 1.2 mg/dL 0.2(L)  Alkaline Phos 38 - 126 U/L 160(H)  AST 15 - 41 U/L 29  ALT 0 - 44 U/L 23    Discharge instruction: per After Visit Summary and "Baby and Me Booklet".  After visit meds:  Allergies as of 12/06/2018   No Known Allergies     Medication List    TAKE these medications   enalapril 10 MG tablet Commonly known as: VASOTEC Take 1 tablet (10 mg total) by mouth daily.   ibuprofen 600 MG tablet Commonly known as: ADVIL Take 1 tablet (600 mg total) by mouth every 6 (six) hours as needed.   prenatal vitamin w/FE, FA 27-1 MG Tabs tablet Take 1 tablet by mouth daily at 12 noon.       Diet: routine diet  Activity: Advance as tolerated. Pelvic rest for 6 weeks.   Outpatient follow up:BP check in 1 wk; preop visit either with the BP check, or 1-2wks later Follow up Appt: Future Appointments  Date Time Provider [place]  12/25/2018  8:35 AM Jennifer Leiter, MD [facility]   Follow up Visit: [facility] (for BP and to schedule interval BTL)   Please schedule this patient for interval f/u for BTL  consult  Newborn Data: Live born female  Birth Weight: 2855gm (6lb 4.7oz) APGAR: 9, 9  Newborn Delivery   Birth date/time: 12/04/2018 20:03:00 Delivery type: Vaginal, Spontaneous      Baby Feeding: Bottle Disposition:home with mother   12/06/2018 Jennifer [facility], CNM  10:08 AM

## 2018-12-04 NOTE — Progress Notes (Signed)
Labor Progress Note Jennifer Elliott is a 28 y.o. G2P1001 at [redacted]w[redacted]d presented for IOL for PEC  S:  Ctx more frequent, having to breath through some. Declines pain meds.   O:  BP (!) 150/90   Pulse 81   Temp 97.9 F (36.6 C) (Oral)   Resp 18   Ht 5\' 2"  (1.575 m)   Wt 71.2 kg   LMP 03/20/2018 (Exact Date)   BMI 28.72 kg/m  EFM: baseline 150 bpm/ mod variability/ + accels/ no decels  Toco: 1.5-4 SVE: Dilation: 3 Effacement (%): 60 Station: -2 Presentation: Vertex Exam by:: Julianne Handler, CNM  Pitocin: 16 mu/min  A/P: 28 y.o. G2P1001 [redacted]w[redacted]d  1. Labor: latent 2. FWB:  Cat I 3. Pain: analgesia/anesthesia prn 4. PEC- stable 5. Hypokalemia- K was near normal yesterday, will recheck tomorrow  Continue Pitocin. Anticipate labor progress and SVD.  Julianne Handler, CNM 2:44 PM

## 2018-12-04 NOTE — Progress Notes (Signed)
Labor Progress Note Jennifer [facility] is a 28 y.o. G2P1001 at [redacted]w[redacted]d presented for IOL for PEC.  S:  Comfortable with epidural. No c/o.  O:  BP 121/70   Pulse 79   Temp 98 F (36.7 C) (Oral)   Resp 18   Ht 5\' 2"  (1.575 m)   Wt 71.2 kg   LMP 03/20/2018 (Exact Date)   SpO2 100%   BMI 28.72 kg/m  EFM: baseline 140 bpm/ mod variability/ + accels/ no decels  Toco: 2-3 SVE: Dilation: 6 Effacement (%): 90 Cervical Position: Middle Station: -1 Presentation: Vertex Exam by:: Julianne Handler, CNM  Pitocin: 16 mu/min  A/P: 28 y.o. G2P1001 [redacted]w[redacted]d  1. Labor: active 2. FWB: Cat I 3. Pain: epidural 4. PEC stable  Consented for AROM. AROM with moderate burgandy fluid. Suspect marginal abruption. Will watch FHT closely. Anticipate SVD.  Julianne Handler, CNM 7:00 PM

## 2018-12-04 NOTE — Anesthesia Preprocedure Evaluation (Signed)
Anesthesia Evaluation  Patient identified by MRN, date of birth, ID band Patient awake    Reviewed: Allergy & Precautions, H&P , NPO status , Patient's Chart, lab work & pertinent test results  Airway Mallampati: I  TM Distance: >3 FB Neck ROM: full    Dental no notable dental hx.    Pulmonary neg pulmonary ROS, Current Smoker,    Pulmonary exam normal breath sounds clear to auscultation       Cardiovascular hypertension, negative cardio ROS Normal cardiovascular exam     Neuro/Psych PSYCHIATRIC DISORDERS Anxiety Depression negative neurological ROS     GI/Hepatic negative GI ROS, Neg liver ROS,   Endo/Other  negative endocrine ROS  Renal/GU negative Renal ROS  negative genitourinary   Musculoskeletal negative musculoskeletal ROS (+)   Abdominal Normal abdominal exam  (+)   Peds negative pediatric ROS (+)  Hematology negative hematology ROS (+)   Anesthesia Other Findings   Reproductive/Obstetrics (+) Pregnancy                             Anesthesia Physical  Anesthesia Plan  ASA: II  Anesthesia Plan: Epidural   Post-op Pain Management:    Induction:   PONV Risk Score and Plan:   Airway Management Planned:   Additional Equipment:   Intra-op Plan:   Post-operative Plan:   Informed Consent: I have reviewed the patients History and Physical, chart, labs and discussed the procedure including the risks, benefits and alternatives for the proposed anesthesia with the patient or authorized representative who has indicated his/her understanding and acceptance.       Plan Discussed with:   Anesthesia Plan Comments:         Anesthesia Quick Evaluation

## 2018-12-04 NOTE — H&P (Addendum)
OBSTETRIC ADMISSION HISTORY AND PHYSICAL  Jennifer [facility] is a 28 y.o. female G2P1001 with IUP at 5150w0d presenting for induction of labor due to  preeclampsia, third trimester. She reports +FMs. No LOF, VB, blurry vision, headaches, peripheral edema, or RUQ pain. She plans on breast feeding. She requests tubal ligation for birth control.  Dating: By LMP --->  Estimated Date of Delivery: 12/25/18  Sono: US on 08/06/2018  @[redacted]w[redacted]d , CWD, normal anatomy, cephalic presentation, 348.99g, 74.5%ile.  Anterior placental lie. AFI WNL.  Prenatal History/Complications: Pre-eclampsia, third trimester Marijuana use, (+) urine drug screen 10/04/18 Nicotine use,  (+) urine drug screen 10/04/18 Hx depression/PTSD  Past Medical History: Past Medical History:  Diagnosis Date  . Anemia   . Anxiety   . Depression    doing good  . Medical history non-contributory   . Pregnancy induced hypertension     Past Surgical History: Past Surgical History:  Procedure Laterality Date  . NO PAST SURGERIES      Obstetrical History: OB History    Gravida  2   Para  1   Term  1   Preterm  0   AB  0   Living  1     SAB  0   TAB  0   Ectopic  0   Multiple      Live Births  1           Social History: Social History   Socioeconomic History  . Marital status: Legally Separated    Spouse name: Not on file  . Number of children: Not on file  . Years of education: Not on file  . Highest education level: Not on file  Occupational History  . Not on file  Social Needs  . Financial resource strain: Not hard at all  . Food insecurity    Worry: Never true    Inability: Never true  . Transportation needs    Medical: No    Non-medical: No  Tobacco Use  . Smoking status: Current Some Day Smoker    Years: 0.50    Types: Cigarettes  . Smokeless tobacco: Never Used  . Tobacco comment: 1-2 cigarettes every few days  Substance and Sexual Activity  . Alcohol use: Not Currently  . Drug  use: Yes    Frequency: 2.0 times per week    Types: Marijuana    Comment: last smoked 11/30/2018  . Sexual activity: Yes    Birth control/protection: None  Lifestyle  . Physical activity    Days per week: Not on file    Minutes per session: Not on file  . Stress: Not on file  Relationships  . Social Musicianconnections    Talks on phone: Not on file    Gets together: Not on file    Attends religious service: Not on file    Active member of club or organization: Not on file    Attends meetings of clubs or organizations: Not on file    Relationship status: Not on file  Other Topics Concern  . Not on file  Social History Narrative   ** Merged History Encounter **        Family History: Family History  Problem Relation Age of Onset  . Other Neg Hx     Allergies: No Known Allergies  Medications Prior to Admission  Medication Sig Dispense Refill Last Dose  . prenatal vitamin w/FE, FA (PRENATAL 1 + 1) 27-1 MG TABS tablet Take 1 tablet by mouth daily  at 12 noon.        Review of Systems   All systems reviewed and negative except as stated in HPI  Blood pressure (!) 150/84, resp. rate 18, height 5\' 2"  (1.575 m), weight 71.2 kg, last menstrual period 03/20/2018, unknown if currently breastfeeding. General appearance: alert, cooperative, appears stated age and no distress Lungs: regular rate and effort Heart: regular rate  Abdomen: soft, non-tender Extremities: Homans sign is negative, no sign of DVT Presentation: cephalic Fetal monitoringBaseline: 145 bpm, Variability: Good {> 6 bpm), Accelerations: Reactive and Decelerations: Absent, Uterine activity: contractions - rare, irregular Dilation: 3 Effacement (%): 60 Station: -2 Exam by:: [email or web address], CNM    Prenatal labs: ABO, Rh: --/--/B POS (09/28 1018) Antibody: NEG (09/28 1018) Rubella:  Immune RPR:   Negative   HBsAg:   Negative HIV:   Nonreactive GBS:   Positive 3 hr GTT: FBS: 93, 1 hr: 178, 2 hr: 155, 3 hr:  128  Prenatal Transfer Tool  Maternal Diabetes: No Genetic Screening: First trimester screen: negative Maternal Ultrasounds/Referrals: Normal Fetal Ultrasounds or other Referrals:  None Maternal Substance Abuse:  Yes:  Type: Smoker, Marijuana Significant Maternal Medications:  None Significant Maternal Lab Results: Group B Strep positive  Results for orders placed or performed during the hospital encounter of 12/04/18 (from the past 24 hour(s))  CBC   Collection Time: 12/04/18  8:10 AM  Result Value Ref Range   WBC 9.6 4.0 - 10.5 K/uL   RBC 3.85 (L) 3.87 - 5.11 MIL/uL   Hemoglobin 12.4 12.0 - 15.0 g/dL   HCT 12/06/18 35.3 - 61.4 %   MCV 95.8 80.0 - 100.0 fL   MCH 32.2 26.0 - 34.0 pg   MCHC 33.6 30.0 - 36.0 g/dL   RDW 43.1 54.0 - 08.6 %   Platelets 157 150 - 400 K/uL   nRBC 0.0 0.0 - 0.2 %  Results for orders placed or performed during the hospital encounter of 12/03/18 (from the past 24 hour(s))  Urinalysis, Routine w reflex microscopic   Collection Time: 12/03/18  9:47 AM  Result Value Ref Range   Color, Urine STRAW (A) YELLOW   APPearance CLEAR CLEAR   Specific Gravity, Urine 1.004 (L) 1.005 - 1.030   pH 7.0 5.0 - 8.0   Glucose, UA NEGATIVE NEGATIVE mg/dL   Hgb urine dipstick NEGATIVE NEGATIVE   Bilirubin Urine NEGATIVE NEGATIVE   Ketones, ur NEGATIVE NEGATIVE mg/dL   Protein, ur NEGATIVE NEGATIVE mg/dL   Nitrite NEGATIVE NEGATIVE   Leukocytes,Ua NEGATIVE NEGATIVE  CBC   Collection Time: 12/03/18  9:49 AM  Result Value Ref Range   WBC 10.2 4.0 - 10.5 K/uL   RBC 3.77 (L) 3.87 - 5.11 MIL/uL   Hemoglobin 12.6 12.0 - 15.0 g/dL   HCT 12/05/18 (L) 95.0 - 93.2 %   MCV 95.0 80.0 - 100.0 fL   MCH 33.4 26.0 - 34.0 pg   MCHC 35.2 30.0 - 36.0 g/dL   RDW 67.1 24.5 - 80.9 %   Platelets 158 150 - 400 K/uL   nRBC 0.0 0.0 - 0.2 %  Comprehensive metabolic panel   Collection Time: 12/03/18  9:49 AM  Result Value Ref Range   Sodium 138 135 - 145 mmol/L   Potassium 3.3 (L) 3.5 - 5.1  mmol/L   Chloride 107 98 - 111 mmol/L   CO2 21 (L) 22 - 32 mmol/L   Glucose, Bld 86 70 - 99 mg/dL   BUN <5 (L) 6 -  20 mg/dL   Creatinine, Ser 0.54 0.44 - 1.00 mg/dL   Calcium 8.4 (L) 8.9 - 10.3 mg/dL   Total Protein 5.7 (L) 6.5 - 8.1 g/dL   Albumin 2.6 (L) 3.5 - 5.0 g/dL   AST 24 15 - 41 U/L   ALT 20 0 - 44 U/L   Alkaline Phosphatase 201 (H) 38 - 126 U/L   Total Bilirubin 0.4 0.3 - 1.2 mg/dL   GFR calc non Af Amer >60 >60 mL/min   GFR calc Af Amer >60 >60 mL/min   Anion gap 10 5 - 15  Protein / creatinine ratio, urine   Collection Time: 12/03/18  9:49 AM  Result Value Ref Range   Creatinine, Urine 34.27 mg/dL   Total Protein, Urine 15 mg/dL   Protein Creatinine Ratio 0.44 (H) 0.00 - 0.15 mg/mg[Cre]  Type and screen [facility]   Collection Time: 12/03/18 10:18 AM  Result Value Ref Range   ABO/RH(D) B POS    Antibody Screen NEG    Sample Expiration      12/06/2018,2359 Performed at [facility], [place] [address], [place], [place] [address]     Patient Active Problem List   Diagnosis Date Noted  . Preeclampsia, third trimester 12/04/2018    Assessment/Plan: Jennifer Elliott is a 28 y.o. female G2P1001 with IUP at [redacted]w[redacted]d presenting for induction of labor due to  Preeclampsia w/o severe features, third trimester.  1. Labor (IOL): Pitocin started 12/04/18 at 0815.  2. FWB: category 1 3. Pain: Managed with pain medication or epidural if requested.  4. GBS: Positive, administered PCN. 5. MOF: bottle 6. MOC: tubal ligation  7. Circumcision: outpatient 8. Social work consult after delivery regarding substance use and history of depression.  Jennifer Elliott, [place]  12/04/2018, 9:06 AM   Midwife attestation: I have seen and examined this patient; I agree with above documentation in the student's note.   PE: Gen: calm comfortable, NAD Resp: normal effort and rate Abd: gravid  ROS, labs, PMH reviewed  Assessment/Plan: Jennifer Elliott is a 28  y.o. G2P1001 here for IOL for PEC, no severe features Admit to LD Labor: latent FWB: Cat I GBS pos>PCN Favorable cervix- start Pitocin Anticipate SVD  Julianne Handler, CNM  12/04/2018, 10:24 AM

## 2018-12-04 NOTE — Anesthesia Procedure Notes (Signed)
Epidural Patient location during procedure: OB Start time: 12/04/2018 3:15 PM End time: 12/04/2018 3:26 PM  Staffing Anesthesiologist: Lynda Rainwater, MD Performed: anesthesiologist   Preanesthetic Checklist Completed: patient identified, site marked, surgical consent, pre-op evaluation, timeout performed, IV checked, risks and benefits discussed and monitors and equipment checked  Epidural Patient position: sitting Prep: ChloraPrep Patient monitoring: heart rate, cardiac monitor, continuous pulse ox and blood pressure Approach: midline Location: L2-L3 Injection technique: LOR saline  Needle:  Needle type: Tuohy  Needle gauge: 17 G Needle length: 9 cm Needle insertion depth: 5 cm Catheter type: closed end flexible Catheter size: 20 Guage Catheter at skin depth: 9 cm Test dose: negative  Assessment Events: blood not aspirated, injection not painful, no injection resistance, negative IV test and no paresthesia  Additional Notes Reason for block:procedure for pain

## 2018-12-05 ENCOUNTER — Encounter (HOSPITAL_COMMUNITY): Payer: Self-pay

## 2018-12-05 LAB — BASIC METABOLIC PANEL
Anion gap: 7 (ref 5–15)
Anion gap: 8 (ref 5–15)
BUN: <5 mg/dL — ABNORMAL LOW (ref 6–20)
BUN: <5 mg/dL — ABNORMAL LOW (ref 6–20)
CO2: 20 mmol/L — ABNORMAL LOW (ref 22–32)
CO2: 22 mmol/L (ref 22–32)
Calcium: 8.1 mg/dL — ABNORMAL LOW (ref 8.9–10.3)
Calcium: 8.7 mg/dL — ABNORMAL LOW (ref 8.9–10.3)
Chloride: 109 mmol/L (ref 98–111)
Chloride: 107 mmol/L (ref 98–111)
Creatinine, Ser: 0.58 mg/dL (ref 0.44–1.00)
Creatinine, Ser: 0.55 mg/dL (ref 0.44–1.00)
GFR calc Af Amer: >60 mL/min (ref >60)
GFR calc Af Amer: >60 mL/min (ref >60)
GFR calc non Af Amer: >60 mL/min (ref >60)
GFR calc non Af Amer: >60 mL/min (ref >60)
Glucose, Bld: 75 mg/dL (ref 70–99)
Glucose, Bld: 104 mg/dL — ABNORMAL HIGH (ref 70–99)
Potassium: 3.1 mmol/L — ABNORMAL LOW (ref 3.5–5.1)
Potassium: 3.4 mmol/L — ABNORMAL LOW (ref 3.5–5.1)
Sodium: 136 mmol/L (ref 135–145)
Sodium: 137 mmol/L (ref 135–145)

## 2018-12-05 LAB — MAGNESIUM
Magnesium: 1.4 mg/dL — ABNORMAL LOW (ref 1.7–2.4)
Magnesium: 2.2 mg/dL (ref 1.7–2.4)

## 2018-12-05 LAB — RPR: RPR Ser Ql: NONREACTIVE — AB

## 2018-12-05 MED ORDER — LABETALOL HCL 5 MG/ML IV SOLN
10.0000 mg | Freq: Once | INTRAVENOUS | Status: AC
  Administered 2018-12-05: 10 mg via INTRAVENOUS
  Filled 2018-12-05: qty 4

## 2018-12-05 MED ORDER — LABETALOL HCL 5 MG/ML IV SOLN: 40.0000 mg | INTRAVENOUS | Status: DC | PRN

## 2018-12-05 MED ORDER — LABETALOL HCL 5 MG/ML IV SOLN: 80.0000 mg | INTRAVENOUS | Status: DC | PRN

## 2018-12-05 MED ORDER — MAGNESIUM SULFATE 2 GM/50ML IV SOLN
2.0000 g | Freq: Once | INTRAVENOUS | Status: AC
  Administered 2018-12-05: 2 g via INTRAVENOUS
  Filled 2018-12-05: qty 50

## 2018-12-05 MED ORDER — LABETALOL HCL 5 MG/ML IV SOLN: 20.0000 mg | INTRAVENOUS | Status: DC | PRN

## 2018-12-05 MED ORDER — ENALAPRIL MALEATE 5 MG PO TABS
10.0000 mg | ORAL_TABLET | Freq: Every day | ORAL | Status: DC
  Administered 2018-12-05 – 2018-12-06 (×2): 10 mg via ORAL
  Filled 2018-12-05 (×2): qty 2

## 2018-12-05 MED ORDER — MAGNESIUM SULFATE 50 % IJ SOLN
2.0000 g | Freq: Once | INTRAMUSCULAR | Status: DC
  Filled 2018-12-05: qty 4

## 2018-12-05 MED ORDER — INFLUENZA VAC SPLIT QUAD 0.5 ML IM SUSY
0.5000 mL | PREFILLED_SYRINGE | INTRAMUSCULAR | Status: AC
  Administered 2018-12-06: 0.5 mL via INTRAMUSCULAR
  Filled 2018-12-05: qty 0.5

## 2018-12-05 MED ORDER — OXYCODONE HCL 5 MG PO TABS
5.0000 mg | ORAL_TABLET | Freq: Once | ORAL | Status: DC
  Filled 2018-12-05: qty 1

## 2018-12-05 MED ORDER — POTASSIUM CHLORIDE CRYS ER 20 MEQ PO TBCR
40.0000 meq | EXTENDED_RELEASE_TABLET | Freq: Two times a day (BID) | ORAL | Status: AC
  Administered 2018-12-05 – 2018-12-06 (×2): 40 meq via ORAL
  Filled 2018-12-05 (×3): qty 2

## 2018-12-05 NOTE — Progress Notes (Signed)
RN not able to give any type of IV Magnesium on MBU.  Pt will need to be transferred to [facility] (1st floor) for this medication.  Dionicio Stall, RN (Charge nurse) will call attending resident/physician to discuss the need to potentially transfer.    Pt states her pain is better with the Tylenol 650mg  and will try the Oxycodone (one time dose)

## 2018-12-05 NOTE — Progress Notes (Signed)
Pt declines oxycodone and will call RN if she needs it.

## 2018-12-05 NOTE — Progress Notes (Signed)
Called "1st Attending L&D", P. Patel answered and she is aware of pt's elevated BP's.  Also will prescribe 2gm IV Mag.  Pt's BP's trending up but pt states that every time we have checked her BP's today she has been "cramping very bad".  I let the resident know that P. Driver did not order any prn BP meds this morning.  Posey Pronto will conference with the team and decide what is best to do and will enter appropriate orders.    Pt asymptomatic (denies HA, Blurry Vision, nausea, sharp abdominal pains).  Will continue to monitor closely.
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
## 2018-12-05 NOTE — Progress Notes (Addendum)
Post Partum Day 1 Subjective: no complaints, up ad lib, voiding, tolerating PO and + flatus  Objective: Blood pressure 131/81, pulse 72, temperature 98 F (36.7 C), temperature source Oral, resp. rate 18, height 5\' 2"  (1.575 m), weight 71.2 kg, last menstrual period 03/20/2018, SpO2 100 %, unknown if currently breastfeeding. Vitals:   12/04/18 2031 12/04/18 2101 12/04/18 2210 12/04/18 2315  BP: 131/79 (!) 144/94 136/84 131/81  Pulse: 80 66 73 72  Resp: 16 17 18 18   Temp:   98.2 F (36.8 C) 98 F (36.7 C)  TempSrc:   Oral Oral  SpO2:   100% 100%  Weight:      Height:        Physical Exam:  General: alert, cooperative and no distress Lochia: appropriate Uterine Fundus: firm Incision: n/a DVT Evaluation: No evidence of DVT seen on physical exam.  Recent Labs    12/03/18 0949 12/04/18 0810  HGB 12.6 12.4  HCT 35.8* 36.9    Assessment/Plan: Plan for discharge tomorrow   LOS: 1 day   Jennifer Elliott 12/05/2018, 6:16 AM

## 2018-12-05 NOTE — Anesthesia Postprocedure Evaluation (Signed)
Anesthesia Post Note  Patient: Jennifer Elliott  Procedure(s) Performed: AN AD [place]     Patient location during evaluation: Mother Baby Anesthesia Type: Epidural Level of consciousness: awake and alert Pain management: pain level controlled Vital Signs Assessment: post-procedure vital signs reviewed and stable Respiratory status: spontaneous breathing, nonlabored ventilation and respiratory function stable Cardiovascular status: stable Postop Assessment: no headache, no backache and epidural receding Anesthetic complications: no    Last Vitals:  Vitals:   12/05/18 0330 12/05/18 0747  BP: 140/79 (!) 161/93  Pulse: 77 67  Resp: 18 20  Temp: 36.8 C 36.8 C  SpO2: 100%     Last Pain:  Vitals:   12/05/18 0747  TempSrc: Oral  PainSc: 0-No pain   Pain Goal:                Epidural/Spinal Function Cutaneous sensation: Normal sensation (12/05/18 0747), Patient able to flex knees: Yes (12/05/18 0747), Patient able to lift hips off bed: Yes (12/05/18 0747), Back pain beyond tenderness at insertion site: No (12/05/18 0747), Progressively worsening motor and/or sensory loss: No (12/05/18 0747), Bowel and/or bladder incontinence post epidural: No (12/05/18 0747)  [place] [employer]

## 2018-12-06 DIAGNOSIS — E876 Hypokalemia: Secondary | ICD-10-CM | POA: Diagnosis present

## 2018-12-06 LAB — COMPREHENSIVE METABOLIC PANEL
ALT: 23 U/L (ref 0–44)
AST: 29 U/L (ref 15–41)
Albumin: 2.5 g/dL — ABNORMAL LOW (ref 3.5–5.0)
Alkaline Phosphatase: 160 U/L — ABNORMAL HIGH (ref 38–126)
Anion gap: 7 (ref 5–15)
BUN: <5 mg/dL — ABNORMAL LOW (ref 6–20)
CO2: 24 mmol/L (ref 22–32)
Calcium: 9 mg/dL (ref 8.9–10.3)
Chloride: 109 mmol/L (ref 98–111)
Creatinine, Ser: 0.68 mg/dL (ref 0.44–1.00)
GFR calc Af Amer: >60 mL/min (ref >60)
GFR calc non Af Amer: >60 mL/min (ref >60)
Glucose, Bld: 97 mg/dL (ref 70–99)
Potassium: 4.1 mmol/L (ref 3.5–5.1)
Sodium: 140 mmol/L (ref 135–145)
Total Bilirubin: 0.2 mg/dL — ABNORMAL LOW (ref 0.3–1.2)
Total Protein: 5.6 g/dL — ABNORMAL LOW (ref 6.5–8.1)

## 2018-12-06 LAB — MAGNESIUM: Magnesium: 1.7 mg/dL (ref 1.7–2.4)

## 2018-12-06 LAB — BIRTH TISSUE RECOVERY COLLECTION (PLACENTA DONATION)

## 2018-12-06 MED ORDER — ENALAPRIL MALEATE 10 MG PO TABS: 10.0000 mg | ORAL_TABLET | Freq: Every day | ORAL | 1 refills | Status: DC

## 2018-12-06 MED ORDER — IBUPROFEN 600 MG PO TABS: 600.0000 mg | ORAL_TABLET | Freq: Four times a day (QID) | ORAL | 0 refills | Status: AC | PRN

## 2018-12-06 MED FILL — ENALAPRIL MALEATE 10 MG TAB: 10 | 30 days supply | Qty: 30 | Fill #0

## 2018-12-06 MED FILL — IBUPROFEN 600 MG TABLET: 600 | 7 days supply | Qty: 30 | Fill #0

## 2018-12-06 NOTE — Discharge Instructions (Signed)
Postpartum Hypertension Postpartum hypertension is high blood pressure that remains higher than normal after childbirth. You may not realize that you have postpartum hypertension if your blood pressure is not being checked regularly. In most cases, postpartum hypertension will go away on its own, usually within a week of delivery. However, for some women, medical treatment is required to prevent serious complications, such as seizures or stroke. What are the causes? This condition may be caused by one or more of the following:  Hypertension that existed before pregnancy (chronic hypertension).  Hypertension that comes on as a result of pregnancy (gestational hypertension).  Hypertensive disorders during pregnancy (preeclampsia) or seizures in women who have high blood pressure during pregnancy (eclampsia).  A condition in which the liver, platelets, and red blood cells are damaged during pregnancy (HELLP syndrome).  A condition in which the thyroid produces too much hormones (hyperthyroidism).  Other rare problems of the nerves (neurological disorders) or blood disorders. In some cases, the cause may not be known. What increases the risk? The following factors may make you more likely to develop this condition:  Chronic hypertension. In some cases, this may not have been diagnosed before pregnancy.  Obesity.  Type 2 diabetes.  Kidney disease.  History of preeclampsia or eclampsia.  Other medical conditions that change the level of hormones in the body (hormonal imbalance). What are the signs or symptoms? As with all types of hypertension, postpartum hypertension may not have any symptoms. Depending on how high your blood pressure is, you may experience:  Headaches. These may be mild, moderate, or severe. They may also be steady, constant, or sudden in onset (thunderclap headache).  Changes in your ability to see (visual changes).  Dizziness.  Shortness of breath.  Swelling  of your hands, feet, lower legs, or face. In some cases, you may have swelling in more than one of these locations.  Heart palpitations or a racing heartbeat.  Difficulty breathing while lying down.  Decrease in the amount of urine that you pass. Other rare signs and symptoms may include:  Sweating more than usual. This lasts longer than a few days after delivery.  Chest pain.  Sudden dizziness when you get up from sitting or lying down.  Seizures.  Nausea or vomiting.  Abdominal pain. How is this diagnosed? This condition may be diagnosed based on the results of a physical exam, blood pressure measurements, and blood and urine tests. You may also have other tests, such as a CT scan or an MRI, to check for other problems of postpartum hypertension. How is this treated? If blood pressure is high enough to require treatment, your options may include:  Medicines to reduce blood pressure (antihypertensives). Tell your health care provider if you are breastfeeding or if you plan to breastfeed. There are many antihypertensive medicines that are safe to take while breastfeeding.  Stopping medicines that may be causing hypertension.  Treating medical conditions that are causing hypertension.  Treating the complications of hypertension, such as seizures, stroke, or kidney problems. Your health care provider will also continue to monitor your blood pressure closely until it is within a safe range for you. Follow these instructions at home:  Take over-the-counter and prescription medicines only as told by your health care provider.  Return to your normal activities as told by your health care provider. Ask your health care provider what activities are safe for you.  Do not use any products that contain nicotine or tobacco, such as cigarettes and e-cigarettes. If   you need help quitting, ask your health care provider.  Keep all follow-up visits as told by your health care provider. This  is important. Contact a health care provider if:  Your symptoms get worse.  You have new symptoms, such as: ? A headache that does not get better. ? Dizziness. ? Visual changes. Get help right away if:  You suddenly develop swelling in your hands, ankles, or face.  You have sudden, rapid weight gain.  You develop difficulty breathing, chest pain, racing heartbeat, or heart palpitations.  You develop severe pain in your abdomen.  You have any symptoms of a stroke. "BE FAST" is an easy way to remember the main warning signs of a stroke: ? B - Balance. Signs are dizziness, sudden trouble walking, or loss of balance. ? E - Eyes. Signs are trouble seeing or a sudden change in vision. ? F - Face. Signs are sudden weakness or numbness of the face, or the face or eyelid drooping on one side. ? A - Arms. Signs are weakness or numbness in an arm. This happens suddenly and usually on one side of the body. ? S - Speech. Signs are sudden trouble speaking, slurred speech, or trouble understanding what people say. ? T - Time. Time to call emergency services. Write down what time symptoms started.  You have other signs of a stroke, such as: ? A sudden, severe headache with no known cause. ? Nausea or vomiting. ? Seizure. These symptoms may represent a serious problem that is an emergency. Do not wait to see if the symptoms will go away. Get medical help right away. Call your local emergency services (911 in the [place] Do not drive yourself to the hospital. Summary  Postpartum hypertension is high blood pressure that remains higher than normal after childbirth.  In most cases, postpartum hypertension will go away on its own, usually within a week of delivery.  For some women, medical treatment is required to prevent serious complications, such as seizures or stroke. This information is not intended to replace advice given to you by your health care provider. Make sure you discuss any questions  you have with your health care provider. Document Released: 10/25/2013 Document Revised: 03/30/2018 Document Reviewed: 12/12/2016 Elsevier Patient Education  2020 [employer]. Postpartum Care After Vaginal Delivery This sheet gives you information about how to care for yourself from the time you deliver your baby to up to 6-12 weeks after delivery (postpartum period). Your health care provider may also give you more specific instructions. If you have problems or questions, contact your health care provider. Follow these instructions at home: Vaginal bleeding  It is normal to have vaginal bleeding (lochia) after delivery. Wear a sanitary pad for vaginal bleeding and discharge. ? During the first week after delivery, the amount and appearance of lochia is often similar to a menstrual period. ? Over the next few weeks, it will gradually decrease to a dry, yellow-brown discharge. ? For most women, lochia stops completely by 4-6 weeks after delivery. Vaginal bleeding can vary from woman to woman.  Change your sanitary pads frequently. Watch for any changes in your flow, such as: ? A sudden increase in volume. ? A change in color. ? Large blood clots.  If you pass a blood clot from your vagina, save it and call your health care provider to discuss. Do not flush blood clots down the toilet before talking with your health care provider.  Do not use tampons or douches until  your health care provider says this is safe.  If you are not breastfeeding, your period should return 6-8 weeks after delivery. If you are feeding your child breast milk only (exclusive breastfeeding), your period may not return until you stop breastfeeding. Perineal care  Keep the area between the vagina and the anus (perineum) clean and dry as told by your health care provider. Use medicated pads and pain-relieving sprays and creams as directed.  If you had a cut in the perineum (episiotomy) or a tear in the vagina, check  the area for signs of infection until you are healed. Check for: ? More redness, swelling, or pain. ? Fluid or blood coming from the cut or tear. ? Warmth. ? Pus or a bad smell.  You may be given a squirt bottle to use instead of wiping to clean the perineum area after you go to the bathroom. As you start healing, you may use the squirt bottle before wiping yourself. Make sure to wipe gently.  To relieve pain caused by an episiotomy, a tear in the vagina, or swollen veins in the anus (hemorrhoids), try taking a warm sitz bath 2-3 times a day. A sitz bath is a warm water bath that is taken while you are sitting down. The water should only come up to your hips and should cover your buttocks. Breast care  Within the first few days after delivery, your breasts may feel heavy, full, and uncomfortable (breast engorgement). Milk may also leak from your breasts. Your health care provider can suggest ways to help relieve the discomfort. Breast engorgement should go away within a few days.  If you are breastfeeding: ? Wear a bra that supports your breasts and fits you well. ? Keep your nipples clean and dry. Apply creams and ointments as told by your health care provider. ? You may need to use breast pads to absorb milk that leaks from your breasts. ? You may have uterine contractions every time you breastfeed for up to several weeks after delivery. Uterine contractions help your uterus return to its normal size. ? If you have any problems with breastfeeding, work with your health care provider or Advertising copywriterlactation consultant.  If you are not breastfeeding: ? Avoid touching your breasts a lot. Doing this can make your breasts produce more milk. ? Wear a good-fitting bra and use cold packs to help with swelling. ? Do not squeeze out (express) milk. This causes you to make more milk. Intimacy and sexuality  Ask your health care provider when you can engage in sexual activity. This may depend on: ? Your risk  of infection. ? How fast you are healing. ? Your comfort and desire to engage in sexual activity.  You are able to get pregnant after delivery, even if you have not had your period. If desired, talk with your health care provider about methods of birth control (contraception). Medicines  Take over-the-counter and prescription medicines only as told by your health care provider.  If you were prescribed an antibiotic medicine, take it as told by your health care provider. Do not stop taking the antibiotic even if you start to feel better. Activity  Gradually return to your normal activities as told by your health care provider. Ask your health care provider what activities are safe for you.  Rest as much as possible. Try to rest or take a nap while your baby is sleeping. Eating and drinking   Drink enough fluid to keep your urine pale yellow.  Eat high-fiber foods every day. These may help prevent or relieve constipation. High-fiber foods include: ? Whole grain cereals and breads. ? Brown rice. ? Beans. ? Fresh fruits and vegetables.  Do not try to lose weight quickly by cutting back on calories.  Take your prenatal vitamins until your postpartum checkup or until your health care provider tells you it is okay to stop. Lifestyle  Do not use any products that contain nicotine or tobacco, such as cigarettes and e-cigarettes. If you need help quitting, ask your health care provider.  Do not drink alcohol, especially if you are breastfeeding. General instructions  Keep all follow-up visits for you and your baby as told by your health care provider. Most women visit their health care provider for a postpartum checkup within the first 3-6 weeks after delivery. Contact a health care provider if:  You feel unable to cope with the changes that your child brings to your life, and these feelings do not go away.  You feel unusually sad or worried.  Your breasts become red, painful, or  hard.  You have a fever.  You have trouble holding urine or keeping urine from leaking.  You have little or no interest in activities you used to enjoy.  You have not breastfed at all and you have not had a menstrual period for 12 weeks after delivery.  You have stopped breastfeeding and you have not had a menstrual period for 12 weeks after you stopped breastfeeding.  You have questions about caring for yourself or your baby.  You pass a blood clot from your vagina. Get help right away if:  You have chest pain.  You have difficulty breathing.  You have sudden, severe leg pain.  You have severe pain or cramping in your lower abdomen.  You bleed from your vagina so much that you fill more than one sanitary pad in one hour. Bleeding should not be heavier than your heaviest period.  You develop a severe headache.  You faint.  You have blurred vision or spots in your vision.  You have bad-smelling vaginal discharge.  You have thoughts about hurting yourself or your baby. If you ever feel like you may hurt yourself or others, or have thoughts about taking your own life, get help right away. You can go to the nearest emergency department or call:  Your local emergency services (911 in the [place]  A suicide crisis helpline, such as the National Suicide Prevention Lifeline at 609-721-3232. This is open 24 hours a day. Summary  The period of time right after you deliver your newborn up to 6-12 weeks after delivery is called the postpartum period.  Gradually return to your normal activities as told by your health care provider.  Keep all follow-up visits for you and your baby as told by your health care provider. This information is not intended to replace advice given to you by your health care provider. Make sure you discuss any questions you have with your health care provider. Document Released: 12/19/2006 Document Revised: 02/24/2017 Document Reviewed:  12/05/2016 Elsevier Patient Education  2020 [employer]. Places to have your son circumcised:    [facility] [phone] $480 by 4 wks  [facility] [phone] $244 by 4 wks  [facility] [phone] $175 by 2 wks  [facility] [phone] $250 by 7 days [facility] [phone] $150 by 4 wks  These prices sometimes change but are roughly what you can expect to pay. Please call and confirm pricing.   Circumcision is  considered an elective/non-medically necessary procedure. There are many reasons parents decide to have their sons circumsized. During the first year of life circumcised males have a reduced risk of urinary tract infections but after this year the rates between circumcised males and uncircumcised males are the same.  It is safe to have your son circumcised outside of the hospital and the places above perform them regularly.

## 2018-12-11 ENCOUNTER — Ambulatory Visit (INDEPENDENT_AMBULATORY_CARE_PROVIDER_SITE_OTHER): Payer: Medicaid Other

## 2018-12-11 ENCOUNTER — Other Ambulatory Visit: Payer: Self-pay

## 2018-12-11 DIAGNOSIS — Z0130 Encounter for examination of blood pressure without abnormal findings: Secondary | ICD-10-CM

## 2018-12-11 NOTE — Progress Notes (Signed)
Called pt for telephone BP check. Pt reports BP cuff batteries are dead, so she is unable to check BP. Pt would like to come into office for in person visit tomorrow. [place] office notified to add pt to nurse schedule on 10/7 at 1345. Pt has no other questions at this time and will follow up tomorrow.  Apolonio Schneiders RN 12/11/18

## 2018-12-12 ENCOUNTER — Other Ambulatory Visit: Payer: Self-pay

## 2018-12-12 ENCOUNTER — Ambulatory Visit (INDEPENDENT_AMBULATORY_CARE_PROVIDER_SITE_OTHER): Payer: Medicaid Other

## 2018-12-12 VITALS — BP 153/103

## 2018-12-12 DIAGNOSIS — Z0130 Encounter for examination of blood pressure without abnormal findings: Secondary | ICD-10-CM

## 2018-12-12 DIAGNOSIS — O165 Unspecified maternal hypertension, complicating the puerperium: Secondary | ICD-10-CM

## 2018-12-12 MED ORDER — ENALAPRIL MALEATE 20 MG PO TABS: 20.0000 mg | ORAL_TABLET | Freq: Every day | ORAL | 0 refills | Status: DC

## 2018-12-12 NOTE — Progress Notes (Signed)
Patient seen and assessed by nursing staff.  Agree with documentation and plan.  

## 2018-12-12 NOTE — Progress Notes (Signed)
I connected with  Jennifer Elliott on 12/12/18 at 1:35 PM by telephone and verified that I am speaking with the correct person using two identifiers.   I discussed the limitations, risks, security and privacy concerns of performing an evaluation and management service by telephone and the availability of in person appointments. I also discussed with the patient that there may be a patient responsible charge related to this service. The patient expressed understanding and agreed to proceed.  Telephone visit today to check BP. Pt is s/p IOL at 37w due to preeclampsia. BP today 154/110 in L arm; 5 minutes later 153/103 in R arm.  Pt reports mild headache for the past 2 days. Reviewed pt's history and today's BP with Dr. Kennon Rounds who recommends pt increase enalapril to 20 mg daily.   Discussed provider's instructions to increase enalapril to 20 mg daily with pt. Notified pt new rx will be sent to pharmacy. Pt educated to take her blood pressure if she experiences any headache, blurry vision, or dizziness and to call our office if the value is greater than 004 systolic or 90 diastolic. Pt verbalizes understanding and will follow up at [facility] visit on 12/25/18.  Annabell Howells, RN 12/12/2018  1:35 PM

## 2018-12-13 ENCOUNTER — Other Ambulatory Visit: Payer: Self-pay | Admitting: *Deleted

## 2018-12-13 MED ORDER — ENALAPRIL MALEATE 20 MG PO TABS: 20.0000 mg | ORAL_TABLET | Freq: Every day | ORAL | 0 refills | Status: AC

## 2018-12-13 NOTE — Telephone Encounter (Signed)
Received fax refill request for 90 day supply for Enalapril . Will route to provider. Traveon Louro,RN

## 2018-12-18 NOTE — Progress Notes (Signed)
Chart reviewed for nurse visit. Agree with plan of care.   Mackensi Mahadeo Lorraine, CNM 12/18/2018 2:13 PM   

## 2018-12-24 ENCOUNTER — Telehealth: Payer: Self-pay | Admitting: Obstetrics & Gynecology

## 2018-12-24 NOTE — Telephone Encounter (Signed)
Called the patient to inform of the upcoming visit and informed of our new location. Left a voicemail message informing the patient if she has been diagnosed with covid, in close contact with someone who has had covid, or experiencing any flu-like symptoms please give our office a call back to reschedule the appointment. In additions due to the covid restrictions no visitors or children are allowed at this time.

## 2018-12-25 ENCOUNTER — Encounter: Payer: Medicaid Other | Admitting: Obstetrics and Gynecology

## 2018-12-25 ENCOUNTER — Encounter: Payer: Self-pay | Admitting: Family Medicine

## 2018-12-25 ENCOUNTER — Telehealth: Payer: Self-pay | Admitting: Obstetrics and Gynecology

## 2018-12-25 NOTE — Telephone Encounter (Signed)
called and left a detailed message to call office regarding getting appointment rescheduled.

## 2018-12-31 ENCOUNTER — Other Ambulatory Visit: Payer: Self-pay

## 2018-12-31 ENCOUNTER — Ambulatory Visit (INDEPENDENT_AMBULATORY_CARE_PROVIDER_SITE_OTHER): Payer: Medicaid Other | Admitting: Obstetrics & Gynecology

## 2018-12-31 DIAGNOSIS — Z3009 Encounter for other general counseling and advice on contraception: Secondary | ICD-10-CM

## 2018-12-31 NOTE — Progress Notes (Signed)
I connected with  Jennifer Elliott on 12/31/18 at 10:55 AM EDT by telephone and verified that I am speaking with the correct person using two identifiers.   I discussed the limitations, risks, security and privacy concerns of performing an evaluation and management service by telephone and the availability of in person appointments. I also discussed with the patient that there may be a patient responsible charge related to this service. The patient expressed understanding and agreed to proceed.  Verdell Carmine, RN 12/31/2018  11:10 AM

## 2018-12-31 NOTE — Progress Notes (Signed)
   TELEHEALTH VIRTUAL GYNECOLOGY VISIT ENCOUNTER NOTE  I connected with Jennifer Elliott on 12/31/18 at 10:55 AM EDT by telephone at home and verified that I am speaking with the correct person using two identifiers.   I discussed the limitations, risks, security and privacy concerns of performing an evaluation and management service by telephone and the availability of in person appointments. I also discussed with the patient that there may be a patient responsible charge related to this service. The patient expressed understanding and agreed to proceed.   History:  Jennifer Elliott is a 28 y.o. [id number] female being evaluated today for unwanted fertility. She declines other forms of contraception. She denies any abnormal vaginal discharge, bleeding, pelvic pain or other concerns.       Past Medical History:  Diagnosis Date  . Anemia   . Anxiety   . Depression    doing good  . Medical history non-contributory   . Pregnancy induced hypertension    Past Surgical History:  Procedure Laterality Date  . NO PAST SURGERIES     The following portions of the patient's history were reviewed and updated as appropriate: allergies, current medications, past family history, past medical history, past social history, past surgical history and problem list.   .   Review of Systems:  Pertinent items noted in HPI and remainder of comprehensive ROS otherwise negative. She is a Agricultural engineer.  Physical Exam:   General:  Alert, oriented and cooperative.   Mental Status: Normal mood and affect perceived. Normal judgment and thought content.  Physical exam deferred due to nature of the encounter  Labs and Imaging No results found for this or any previous visit (from the past 336 hour(s)). No results found.    Assessment and Plan:    unwanted fertility - I discussed her options of BTL with Filsche clips and bilateral salpingectomy, risks and benefits of both discussed. She opts for [employer] clips.       I discussed the assessment and treatment plan with the patient. The patient was provided an opportunity to ask questions and all were answered. The patient agreed with the plan and demonstrated an understanding of the instructions.   The patient was advised to call back or seek an in-person evaluation/go to the ED if the symptoms worsen or if the condition fails to improve as anticipated.  I provided 10 minutes of non-face-to-face time during this encounter.   Emily Filbert, MD [facility], [place]

## 2019-01-02 ENCOUNTER — Encounter (HOSPITAL_BASED_OUTPATIENT_CLINIC_OR_DEPARTMENT_OTHER): Payer: Self-pay | Admitting: *Deleted

## 2019-01-02 ENCOUNTER — Other Ambulatory Visit: Payer: Self-pay

## 2019-01-05 ENCOUNTER — Other Ambulatory Visit (HOSPITAL_COMMUNITY)
Admission: RE | Admit: 2019-01-05 | Discharge: 2019-01-05 | Disposition: A | Discharge status: Home / Self Care | Payer: Medicaid Other | Source: Ambulatory Visit | Attending: Obstetrics & Gynecology | Admitting: Obstetrics & Gynecology

## 2019-01-05 DIAGNOSIS — Z01812 Encounter for preprocedural laboratory examination: Secondary | ICD-10-CM | POA: Insufficient documentation

## 2019-01-05 DIAGNOSIS — Z20828 Contact with and (suspected) exposure to other viral communicable diseases: Secondary | ICD-10-CM | POA: Diagnosis not present

## 2019-01-06 LAB — NOVEL CORONAVIRUS, NAA (HOSP ORDER, SEND-OUT TO REF LAB; TAT 18-24 HRS): SARS-CoV-2, NAA: NOT DETECTED

## 2019-01-07 ENCOUNTER — Encounter (HOSPITAL_BASED_OUTPATIENT_CLINIC_OR_DEPARTMENT_OTHER)
Admission: RE | Admit: 2019-01-07 | Discharge: 2019-01-07 | Disposition: A | Discharge status: Home / Self Care | Payer: Medicaid Other | Source: Ambulatory Visit | Attending: Obstetrics & Gynecology | Admitting: Obstetrics & Gynecology

## 2019-01-07 ENCOUNTER — Other Ambulatory Visit: Payer: Self-pay

## 2019-01-07 DIAGNOSIS — Z01812 Encounter for preprocedural laboratory examination: Secondary | ICD-10-CM | POA: Diagnosis not present

## 2019-01-07 LAB — POCT PREGNANCY, URINE: Preg Test, Ur: NEGATIVE

## 2019-01-07 NOTE — Progress Notes (Signed)

## 2019-01-09 ENCOUNTER — Encounter (HOSPITAL_BASED_OUTPATIENT_CLINIC_OR_DEPARTMENT_OTHER): Payer: Self-pay | Admitting: Certified Registered"

## 2019-01-09 ENCOUNTER — Ambulatory Visit (HOSPITAL_BASED_OUTPATIENT_CLINIC_OR_DEPARTMENT_OTHER)
Admission: RE | Admit: 2019-01-09 | Discharge: 2019-01-09 | Disposition: A | Discharge status: Home / Self Care | Payer: Medicaid Other | Attending: Obstetrics & Gynecology | Admitting: Obstetrics & Gynecology

## 2019-01-09 ENCOUNTER — Encounter (HOSPITAL_BASED_OUTPATIENT_CLINIC_OR_DEPARTMENT_OTHER)
Admission: RE | Disposition: A | Discharge status: Home / Self Care | Payer: Self-pay | Source: Home / Self Care | Attending: Obstetrics & Gynecology

## 2019-01-09 ENCOUNTER — Ambulatory Visit (HOSPITAL_BASED_OUTPATIENT_CLINIC_OR_DEPARTMENT_OTHER): Payer: Medicaid Other | Admitting: Certified Registered"

## 2019-01-09 ENCOUNTER — Other Ambulatory Visit: Payer: Self-pay

## 2019-01-09 DIAGNOSIS — D649 Anemia, unspecified: Secondary | ICD-10-CM | POA: Diagnosis not present

## 2019-01-09 DIAGNOSIS — Z302 Encounter for sterilization: Secondary | ICD-10-CM | POA: Insufficient documentation

## 2019-01-09 DIAGNOSIS — F419 Anxiety disorder, unspecified: Secondary | ICD-10-CM | POA: Diagnosis not present

## 2019-01-09 DIAGNOSIS — F329 Major depressive disorder, single episode, unspecified: Secondary | ICD-10-CM | POA: Insufficient documentation

## 2019-01-09 DIAGNOSIS — Z79899 Other long term (current) drug therapy: Secondary | ICD-10-CM | POA: Diagnosis not present

## 2019-01-09 DIAGNOSIS — F17210 Nicotine dependence, cigarettes, uncomplicated: Secondary | ICD-10-CM | POA: Insufficient documentation

## 2019-01-09 HISTORY — PX: LAPAROSCOPIC TUBAL LIGATION: SHX1937

## 2019-01-09 SURGERY — LIGATION, FALLOPIAN TUBE, LAPAROSCOPIC
Anesthesia: General | Site: Pelvis | Laterality: Bilateral

## 2019-01-09 MED ORDER — OXYCODONE-ACETAMINOPHEN 5-325 MG PO TABS: 1.0000 | ORAL_TABLET | Freq: Four times a day (QID) | ORAL | 0 refills | Status: AC | PRN

## 2019-01-09 MED ORDER — PROPOFOL 10 MG/ML IV BOLUS
INTRAVENOUS | Status: DC | PRN
  Administered 2019-01-09: 200 mg via INTRAVENOUS

## 2019-01-09 MED ORDER — ROCURONIUM BROMIDE 100 MG/10ML IV SOLN
INTRAVENOUS | Status: DC | PRN
  Administered 2019-01-09: 50 mg via INTRAVENOUS

## 2019-01-09 MED ORDER — FENTANYL CITRATE (PF) 100 MCG/2ML IJ SOLN
INTRAMUSCULAR | Status: AC
  Filled 2019-01-09: qty 2

## 2019-01-09 MED ORDER — LIDOCAINE HCL (CARDIAC) PF 100 MG/5ML IV SOSY
PREFILLED_SYRINGE | INTRAVENOUS | Status: DC | PRN
  Administered 2019-01-09: 60 mg via INTRAVENOUS

## 2019-01-09 MED ORDER — MIDAZOLAM HCL 2 MG/2ML IJ SOLN
INTRAMUSCULAR | Status: AC
  Filled 2019-01-09: qty 2

## 2019-01-09 MED ORDER — SUGAMMADEX SODIUM 200 MG/2ML IV SOLN
INTRAVENOUS | Status: DC | PRN
  Administered 2019-01-09: 200 mg via INTRAVENOUS

## 2019-01-09 MED ORDER — FENTANYL CITRATE (PF) 100 MCG/2ML IJ SOLN
25.0000 ug | INTRAMUSCULAR | Status: DC | PRN
  Administered 2019-01-09 (×3): 50 ug via INTRAVENOUS

## 2019-01-09 MED ORDER — DEXAMETHASONE SODIUM PHOSPHATE 4 MG/ML IJ SOLN
INTRAMUSCULAR | Status: DC | PRN
  Administered 2019-01-09: 5 mg via INTRAVENOUS

## 2019-01-09 MED ORDER — ESMOLOL HCL 100 MG/10ML IV SOLN
INTRAVENOUS | Status: AC
  Filled 2019-01-09: qty 10

## 2019-01-09 MED ORDER — ONDANSETRON HCL 4 MG/2ML IJ SOLN
4.0000 mg | Freq: Once | INTRAMUSCULAR | Status: AC | PRN
  Administered 2019-01-09: 4 mg via INTRAVENOUS

## 2019-01-09 MED ORDER — MIDAZOLAM HCL 2 MG/2ML IJ SOLN
1.0000 mg | INTRAMUSCULAR | Status: DC | PRN
  Administered 2019-01-09: 2 mg via INTRAVENOUS

## 2019-01-09 MED ORDER — OXYCODONE HCL 5 MG PO TABS
5.0000 mg | ORAL_TABLET | Freq: Once | ORAL | Status: AC | PRN
  Administered 2019-01-09: 15:00:00 5 mg via ORAL

## 2019-01-09 MED ORDER — BUPIVACAINE HCL (PF) 0.5 % IJ SOLN
INTRAMUSCULAR | Status: AC
  Filled 2019-01-09: qty 30

## 2019-01-09 MED ORDER — OXYCODONE HCL 5 MG PO TABS
ORAL_TABLET | ORAL | Status: AC
  Filled 2019-01-09: qty 1

## 2019-01-09 MED ORDER — SUGAMMADEX SODIUM 500 MG/5ML IV SOLN
INTRAVENOUS | Status: AC
  Filled 2019-01-09: qty 5

## 2019-01-09 MED ORDER — LACTATED RINGERS IV SOLN
INTRAVENOUS | Status: DC
  Administered 2019-01-09 (×2): via INTRAVENOUS

## 2019-01-09 MED ORDER — OXYCODONE HCL 5 MG/5ML PO SOLN: 5.0000 mg | Freq: Once | ORAL | Status: AC | PRN

## 2019-01-09 MED ORDER — BUPIVACAINE HCL (PF) 0.5 % IJ SOLN
INTRAMUSCULAR | Status: DC | PRN
  Administered 2019-01-09: 10 mL

## 2019-01-09 MED ORDER — FENTANYL CITRATE (PF) 100 MCG/2ML IJ SOLN
50.0000 ug | INTRAMUSCULAR | Status: DC | PRN
  Administered 2019-01-09: 100 ug via INTRAVENOUS

## 2019-01-09 SURGICAL SUPPLY — 36 items
BNDG ADH 1X3 SHEER STRL LF (GAUZE/BANDAGES/DRESSINGS) ×12 IMPLANT
BRIEF STRETCH FOR OB PAD XXL (UNDERPADS AND DIAPERS) ×4 IMPLANT
CLIP FILSHIE TUBAL LIGA STRL (Clip) ×2 IMPLANT
DRSG OPSITE POSTOP 3X4 (GAUZE/BANDAGES/DRESSINGS) IMPLANT
DURAPREP 26ML APPLICATOR (WOUND CARE) ×4 IMPLANT
GAUZE 4X4 16PLY RFD (DISPOSABLE) ×4 IMPLANT
GLOVE BIO SURGEON STRL SZ 6.5 (GLOVE) ×3 IMPLANT
GLOVE BIO SURGEON STRL SZ7 (GLOVE) ×2 IMPLANT
GLOVE BIO SURGEONS STRL SZ 6.5 (GLOVE) ×1
GLOVE BIOGEL PI IND STRL 7.0 (GLOVE) ×4 IMPLANT
GLOVE BIOGEL PI IND STRL 7.5 (GLOVE) IMPLANT
GLOVE BIOGEL PI INDICATOR 7.0 (GLOVE) ×6
GLOVE BIOGEL PI INDICATOR 7.5 (GLOVE) ×2
GOWN STRL REUS W/ TWL XL LVL3 (GOWN DISPOSABLE) ×2 IMPLANT
GOWN STRL REUS W/TWL LRG LVL3 (GOWN DISPOSABLE) ×4 IMPLANT
GOWN STRL REUS W/TWL XL LVL3 (GOWN DISPOSABLE) ×2
NEEDLE INSUFFLATION 120MM (ENDOMECHANICALS) ×4 IMPLANT
NS IRRIG 1000ML POUR BTL (IV SOLUTION) ×4 IMPLANT
PACK LAPAROSCOPY BASIN (CUSTOM PROCEDURE TRAY) ×4 IMPLANT
PACK TRENDGUARD 450 HYBRID PRO (MISCELLANEOUS) IMPLANT
PACK TRENDGUARD 600 HYBRD PROC (MISCELLANEOUS) IMPLANT
PAD OB MATERNITY 4.3X12.25 (PERSONAL CARE ITEMS) ×4 IMPLANT
PAD PREP 24X48 CUFFED NSTRL (MISCELLANEOUS) ×4 IMPLANT
SET TUBE SMOKE EVAC HIGH FLOW (TUBING) ×4 IMPLANT
SHEARS HARMONIC ACE PLUS 36CM (ENDOMECHANICALS) ×4 IMPLANT
SLEEVE SCD COMPRESS KNEE MED (MISCELLANEOUS) ×8 IMPLANT
SLEEVE XCEL OPT CAN 5 100 (ENDOMECHANICALS) ×4 IMPLANT
SUT VICRYL 0 UR6 27IN ABS (SUTURE) ×2 IMPLANT
SUT VICRYL 4-0 PS2 18IN ABS (SUTURE) ×2 IMPLANT
SYR 30ML LL (SYRINGE) ×2 IMPLANT
TOWEL GREEN STERILE FF (TOWEL DISPOSABLE) ×8 IMPLANT
TRENDGUARD 450 HYBRID PRO PACK (MISCELLANEOUS) ×4
TRENDGUARD 600 HYBRID PROC PK (MISCELLANEOUS)
TROCAR OPTI TIP 5M 100M (ENDOMECHANICALS) ×4 IMPLANT
TROCAR XCEL DIL TIP R 11M (ENDOMECHANICALS) ×2 IMPLANT
WARMER LAPAROSCOPE (MISCELLANEOUS) ×4 IMPLANT

## 2019-01-09 NOTE — Anesthesia Preprocedure Evaluation (Addendum)
Anesthesia Evaluation  Patient identified by MRN, date of birth, ID band Patient awake    Reviewed: Allergy & Precautions, NPO status , Patient's Chart, lab work & pertinent test results  History of Anesthesia Complications Negative for: history of anesthetic complications  Airway Mallampati: II  TM Distance: >3 FB Neck ROM: Full    Dental  (+) Teeth Intact   Pulmonary Current Smoker and Patient abstained from smoking.,    Pulmonary exam normal        Cardiovascular hypertension, Normal cardiovascular exam     Neuro/Psych negative neurological ROS  negative psych ROS   GI/Hepatic negative GI ROS, Neg liver ROS,   Endo/Other  negative endocrine ROS  Renal/GU negative Renal ROS  negative genitourinary   Musculoskeletal negative musculoskeletal ROS (+)   Abdominal   Peds  Hematology negative hematology ROS (+)   Anesthesia Other Findings   Reproductive/Obstetrics                           Anesthesia Physical Anesthesia Plan  ASA: II  Anesthesia Plan: General   Post-op Pain Management:    Induction: Intravenous  PONV Risk Score and Plan: 2 and Ondansetron, Dexamethasone, Treatment may vary due to age or medical condition and Midazolam  Airway Management Planned: Oral ETT  Additional Equipment: None  Intra-op Plan:   Post-operative Plan: Extubation in OR  Informed Consent: I have reviewed the patients History and Physical, chart, labs and discussed the procedure including the risks, benefits and alternatives for the proposed anesthesia with the patient or authorized representative who has indicated his/her understanding and acceptance.     Dental advisory given  Plan Discussed with:   Anesthesia Plan Comments:        Anesthesia Quick Evaluation

## 2019-01-09 NOTE — Discharge Instructions (Signed)
Laparoscopic Tubal Ligation, Care After °This sheet gives you information about how to care for yourself after your procedure. Your health care provider may also give you more specific instructions. If you have problems or questions, contact your health care provider. °What can I expect after the procedure? °After the procedure, it is common to have: °· A sore throat. °· Discomfort in your shoulder. °· Mild discomfort or cramping in your abdomen. °· Gas pains. °· Pain or soreness in the area where the surgical incision was made. °· A bloated feeling. °· Tiredness. °· Nausea. °· Vomiting. °Follow these instructions at home: °Medicines °· Take over-the-counter and prescription medicines only as told by your health care provider. °· Do not take aspirin because it can cause bleeding. °· Ask your health care provider if the medicine prescribed to you: °? Requires you to avoid driving or using heavy machinery. °? Can cause constipation. You may need to take actions to prevent or treat constipation, such as: °§ Drink enough fluid to keep your urine pale yellow. °§ Take over-the-counter or prescription medicines. °§ Eat foods that are high in fiber, such as beans, whole grains, and fresh fruits and vegetables. °§ Limit foods that are high in fat and processed sugars, such as fried or sweet foods. °Incision care ° °  ° °· Follow instructions from your health care provider about how to take care of your incision. Make sure you: °? Wash your hands with soap and water before and after you change your bandage (dressing). If soap and water are not available, use hand sanitizer. °? Change your dressing as told by your health care provider. °? Leave stitches (sutures), skin glue, or adhesive strips in place. These skin closures may need to stay in place for 2 weeks or longer. If adhesive strip edges start to loosen and curl up, you may trim the loose edges. Do not remove adhesive strips completely unless your health care provider  tells you to do that. °· Check your incision area every day for signs of infection. Check for: °? Redness, swelling, or pain. °? Fluid or blood. °? Warmth. °? Pus or a bad smell. °Activity °· Rest as told by your health care provider. °· Avoid sitting for a long time without moving. Get up to take short walks every 1-2 hours. This is important to improve blood flow and breathing. Ask for help if you feel weak or unsteady. °· Return to your normal activities as told by your health care provider. Ask your health care provider what activities are safe for you. °General instructions °· Do not take baths, swim, or use a hot tub until your health care provider approves. Ask your health care provider if you may take showers. You may only be allowed to take sponge baths. °· Have someone help you with your daily household tasks for the first few days. °· Keep all follow-up visits as told by your health care provider. This is important. °Contact a health care provider if: °· You have redness, swelling, or pain around your incision. °· Your incision feels warm to the touch. °· You have pus or a bad smell coming from your incision. °· The edges of your incision break open after the sutures have been removed. °· Your pain does not improve after 2-3 days. °· You have a rash. °· You repeatedly become dizzy or light-headed. °· Your pain medicine is not helping. °Get help right away if you: °· Have a fever. °· Faint. °· Have increasing   pain in your abdomen.  Have severe pain in one or both of your shoulders.  Have fluid or blood coming from your sutures or from your vagina.  Have shortness of breath or difficulty breathing.  Have chest pain or leg pain.  Have ongoing nausea, vomiting, or diarrhea. Summary  After the procedure, it is common to have mild discomfort or cramping in your abdomen.  Take over-the-counter and prescription medicines only as told by your health care provider.  Watch for symptoms that should  prompt you to call your health care provider.  Keep all follow-up visits as told by your health care provider. This is important. This information is not intended to replace advice given to you by your health care provider. Make sure you discuss any questions you have with your health care provider. Document Released: 09/10/2004 Document Revised: 01/16/2018 Document Reviewed: 01/16/2018 Elsevier Patient Education  [place].   USE CONDOMS FOR THE NEXT MONTH WITH ANY INTERCOURSE.    No Oxycodone until 9:15 PM on 01/09/2019   Post Anesthesia Home Care Instructions  Activity: Get plenty of rest for the remainder of the day. A responsible individual must stay with you for 24 hours following the procedure.  For the next 24 hours, DO NOT: -Drive a car -Paediatric nurse -Drink alcoholic beverages -Take any medication unless instructed by your physician -Make any legal decisions or sign important papers.  Meals: Start with liquid foods such as gelatin or soup. Progress to regular foods as tolerated. Avoid greasy, spicy, heavy foods. If nausea and/or vomiting occur, drink only clear liquids until the nausea and/or vomiting subsides. Call your physician if vomiting continues.  Special Instructions/Symptoms: Your throat may feel dry or sore from the anesthesia or the breathing tube placed in your throat during surgery. If this causes discomfort, gargle with warm salt water. The discomfort should disappear within 24 hours.  If you had a scopolamine patch placed behind your ear for the management of post- operative nausea and/or vomiting:  1. The medication in the patch is effective for 72 hours, after which it should be removed.  Wrap patch in a tissue and discard in the trash. Wash hands thoroughly with soap and water. 2. You may remove the patch earlier than 72 hours if you experience unpleasant side effects which may include dry mouth, dizziness or visual disturbances. 3. Avoid  touching the patch. Wash your hands with soap and water after contact with the patch.

## 2019-01-09 NOTE — Anesthesia Postprocedure Evaluation (Signed)
Anesthesia Post Note  Patient: Jennifer Elliott  Procedure(s) Performed: Laparoscopic Application of Filshie Clips for Permanent Sterility (Bilateral Pelvis)     Patient location during evaluation: PACU Anesthesia Type: General Level of consciousness: awake and alert Pain management: pain level controlled Vital Signs Assessment: post-procedure vital signs reviewed and stable Respiratory status: spontaneous breathing, nonlabored ventilation and respiratory function stable Cardiovascular status: blood pressure returned to baseline and stable Postop Assessment: no apparent nausea or vomiting Anesthetic complications: no    Last Vitals:  Vitals:   01/09/19 1500 01/09/19 1521  BP: (!) 173/102 (!) 168/98  Pulse:  75  Resp:  16  Temp:  37.1 C  SpO2:  100%    Last Pain:  Vitals:   01/09/19 1521  TempSrc:   PainSc: 3                  Lidia Collum

## 2019-01-09 NOTE — Anesthesia Procedure Notes (Signed)
Procedure Name: Intubation Date/Time: 01/09/2019 12:42 PM Performed by: Signe Colt, CRNA Pre-anesthesia Checklist: Patient identified, Emergency Drugs available, Suction available and Patient being monitored Patient Re-evaluated:Patient Re-evaluated prior to induction Oxygen Delivery Method: Circle system utilized Preoxygenation: Pre-oxygenation with 100% oxygen Induction Type: IV induction Ventilation: Mask ventilation without difficulty Laryngoscope Size: Mac and 4 Grade View: Grade I Tube type: Oral Tube size: 7.0 mm Number of attempts: 1 Airway Equipment and Method: Stylet and Oral airway Placement Confirmation: ETT inserted through vocal cords under direct vision,  positive ETCO2 and breath sounds checked- equal and bilateral Secured at: 21 cm Tube secured with: Tape Dental Injury: Teeth and Oropharynx as per pre-operative assessment

## 2019-01-09 NOTE — Op Note (Signed)
01/09/2019  1:14 PM  PATIENT:  Jennifer Elliott  28 y.o. female  PRE-OPERATIVE DIAGNOSIS:  Undesired Fertility  POST-OPERATIVE DIAGNOSIS:  same  PROCEDURE:  Procedure(s): Laparoscopic Application of Filshie Clips for Permanent Sterility (Bilateral)  SURGEON:  Surgeon(s) and Role:    * India Jolin C, MD - Primary  ANESTHESIA:   local and general  EBL:  minimal  BLOOD ADMINISTERED:none  DRAINS: none   LOCAL MEDICATIONS USED:  MARCAINE     SPECIMEN:  No Specimen  DISPOSITION OF SPECIMEN:  N/A  COUNTS:  YES  TOURNIQUET:  * No tourniquets in log *  DICTATION: .Dragon Dictation  PLAN OF CARE: Discharge to home after PACU  PATIENT DISPOSITION:  PACU - hemodynamically stable.   Delay start of Pharmacological VTE agent (>24hrs) due to surgical blood loss or risk of bleeding: not applicable   The risks, benefits, alternatives of surgery were explained understood, accepted. All questions were answered. She understands the failure rate of 1/400. She declines alternative forms of birth control. Her urine pregnancy test was negative. She was taken to the operating room and general anesthesia was applied without complication. She was placed in dorsal lithotomy position. Her abdomen and vagina were prepped and draped in the usual sterile fashion. A time out procedure was done. A bimanual exam revealed a normal, size and shape mobile uterus with nonenlarged adnexa. A Hulka manipulator was placed on the cervix. Gloves were changed and attention was turned to the abdomen. Approximately 10 mL of 0.5% Marcaine was used to infiltrate the subcutaneous tissue at the umbilicus. A 1 cm umbilical incision was made. A Veres needle was placed in the pelvis. Low-flow CO2 was used to insufflate the abdomen to approximately 3 L. After good pneumoperitoneum was established, a 11 mm trocar was placed. Laparoscopy confirmed correct placement. She was placed in the Trendelenburg position. Patient abdominal  pressure was always less than 15. Her upper abdomen appeared normal. Her uterus ovaries and tubes appeared normal. There was no evidence of endometriosis. The oviducts were visually traced to their fimbriated end on each side. In the isthmic region of each oviduct, a Filshie clip was placed across the entire oviduct. There was no bleeding. The CO2 was allowed to escape from her abdomen. The umbilical fascia was closed with a 0 Vicryl suture. No defects were palpable. The subcuticular closure was done with 4-0 Vicryl suture. The Hulka manipulator was removed. She was extubated and taken to recovery in stable condition. She tolerated the procedure well. The instrument, sponge, and needle counts were correct.

## 2019-01-09 NOTE — H&P (Signed)
Jennifer Elliott is an 28 y.o. separated AA P38 (32 and 47 month old) here for a laparoscopic application of Filsche clips. She says that her periods have been heavy and somewhat painful since the birth of her first child. She is certain that she does not want more kids. She declines alternative forms of birth control.    Patient's last menstrual period was 12/04/2018 (exact date).    Past Medical History:  Diagnosis Date  . Anemia   . Anxiety   . Depression    doing good  . Medical history non-contributory   . Pregnancy induced hypertension     Past Surgical History:  Procedure Laterality Date  . NO PAST SURGERIES      Family History  Problem Relation Age of Onset  . Other Neg Hx     Social History:  reports that she has been smoking cigarettes. She has been smoking about 0.00 packs per day for the past 0.00 years. She has never used smokeless tobacco. She reports previous alcohol use. She reports current drug use. Frequency: 2.00 times per week. Drug: Marijuana.  Allergies: No Known Allergies  Medications Prior to Admission  Medication Sig Dispense Refill Last Dose  . enalapril (VASOTEC) 20 MG tablet Take 1 tablet (20 mg total) by mouth daily. 90 tablet 0 Past Month at Unknown time  . ibuprofen (ADVIL) 600 MG tablet Take 1 tablet (600 mg total) by mouth every 6 (six) hours as needed. 30 tablet 0 Past Month at Unknown time  . prenatal vitamin w/FE, FA (PRENATAL 1 + 1) 27-1 MG TABS tablet Take 1 tablet by mouth daily at 12 noon.   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  Blood pressure 119/76, pulse 89, temperature 98.2 F (36.8 C), temperature source Oral, height 5\' 2"  (1.575 m), weight 62.6 kg, last menstrual period 12/04/2018, SpO2 100 %, not currently breastfeeding. Physical Exam  Breathing, conversing, and ambulating normally Well nourished, well hydrated Black female, no apparent distress Heart- rrr Lungs- CTAB Abd- benign  No results found for this or  any previous visit (from the past 24 hour(s)).  No results found.  Assessment/Plan: Unwanted fertility- plan for laparoscopic application of Filsche clips for permanent sterility. She understands that there is a small failure rate of about 1 in 400.  She understands the risks of surgery, including, but not to infection, bleeding, DVTs, damage to bowel, bladder, ureters. She wishes to proceed.     Emily Filbert 01/09/2019, 12:11 PM

## 2019-01-09 NOTE — Transfer of Care (Signed)
Immediate Anesthesia Transfer of Care Note  Patient: Jennifer Elliott  Procedure(s) Performed: Laparoscopic Application of Filshie Clips for Permanent Sterility (Bilateral Pelvis)  Patient Location: PACU  Anesthesia Type:General  Level of Consciousness: awake, alert , oriented and patient cooperative  Airway & Oxygen Therapy: Patient Spontanous Breathing and Patient connected to face mask oxygen  Post-op Assessment: Report given to RN and Post -op Vital signs reviewed and stable  Post vital signs: Reviewed and stable  Last Vitals:  Vitals Value Taken Time  BP    Temp    Pulse 102 01/09/19 1325  Resp    SpO2 100 % 01/09/19 1325  Vitals shown include unvalidated device data.  Last Pain:  Vitals:   01/09/19 1151  TempSrc: Oral  PainSc: 0-No pain      Patients Stated Pain Goal: 4 (33/35/45 6256)  Complications: No apparent anesthesia complications

## 2019-01-10 ENCOUNTER — Encounter (HOSPITAL_BASED_OUTPATIENT_CLINIC_OR_DEPARTMENT_OTHER): Payer: Self-pay | Admitting: Obstetrics & Gynecology

## 2019-05-17 ENCOUNTER — Ambulatory Visit: Payer: Medicaid Other | Attending: Internal Medicine

## 2019-05-17 DIAGNOSIS — Z20822 Contact with and (suspected) exposure to covid-19: Secondary | ICD-10-CM

## 2019-05-18 LAB — NOVEL CORONAVIRUS, NAA: SARS-CoV-2, NAA: NOT DETECTED

## 2019-11-23 ENCOUNTER — Telehealth: Payer: Self-pay

## 2019-11-23 ENCOUNTER — Inpatient Hospital Stay (HOSPITAL_COMMUNITY)
Admission: AD | Admit: 2019-11-23 | Discharge: 2019-11-23 | Disposition: A | Discharge status: Home / Self Care | Payer: Medicaid Other | Attending: Obstetrics and Gynecology | Admitting: Obstetrics and Gynecology

## 2019-11-23 ENCOUNTER — Encounter (HOSPITAL_COMMUNITY): Payer: Self-pay | Admitting: *Deleted

## 2019-11-23 DIAGNOSIS — R1030 Lower abdominal pain, unspecified: Secondary | ICD-10-CM | POA: Diagnosis present

## 2019-11-23 DIAGNOSIS — Z3202 Encounter for pregnancy test, result negative: Secondary | ICD-10-CM | POA: Diagnosis not present

## 2019-11-23 LAB — CBC
HCT: 44.3 % (ref 36.0–46.0)
Hemoglobin: 14.5 g/dL (ref 12.0–15.0)
MCH: 30.9 pg (ref 26.0–34.0)
MCHC: 32.7 g/dL (ref 30.0–36.0)
MCV: 94.3 fL (ref 80.0–100.0)
Platelets: 274 10*3/uL (ref 150–400)
RBC: 4.7 MIL/uL (ref 3.87–5.11)
RDW: 13.6 % (ref 11.5–15.5)
WBC: 8 10*3/uL (ref 4.0–10.5)
nRBC: 0 % (ref 0.0–0.2)

## 2019-11-23 LAB — HCG, QUANTITATIVE, PREGNANCY: hCG, Beta Chain, Quant, S: <1 m[IU]/mL (ref <5)

## 2019-11-23 NOTE — MAU Provider Note (Signed)
First Provider Initiated Contact with Patient 11/23/19 1117      S Ms. Jennifer Elliott is a 29 y.o. [id number] who presents to MAU today with complaint of cramping and possible pregnancy. She reports some lower abdominal cramping yesterday so she took a pregnancy test. She was unable to interpret the results yesterday so she took another test this am and it was positive. She had her tubes tied in November of 2020.   O BP 127/69 (BP Location: Right Arm)   Pulse 72   Temp 98.1 F (36.7 C) (Oral)   Resp 16   Ht 5\' 1"  (1.549 m)   Wt 64.2 kg   LMP 10/25/2019   SpO2 100%   BMI 26.74 kg/m  Physical Exam Vitals and nursing note reviewed.  Constitutional:      General: She is not in acute distress.    Appearance: She is well-developed.  HENT:     Head: Normocephalic.  Eyes:     Pupils: Pupils are equal, round, and reactive to light.  Cardiovascular:     Rate and Rhythm: Normal rate and regular rhythm.     Heart sounds: Normal heart sounds.  Pulmonary:     Effort: Pulmonary effort is normal. No respiratory distress.     Breath sounds: Normal breath sounds.  Abdominal:     General: Bowel sounds are normal. There is no distension.     Palpations: Abdomen is soft.     Tenderness: There is no abdominal tenderness.  Skin:    General: Skin is warm and dry.  Neurological:     Mental Status: She is alert and oriented to person, place, and time.  Psychiatric:        Behavior: Behavior normal.        Thought Content: Thought content normal.        Judgment: Judgment normal.    Results for orders placed or performed during the hospital encounter of 11/23/19 (from the past 24 hour(s))  CBC     Status: None   Collection Time: 11/23/19 12:04 PM  Result Value Ref Range   WBC 8.0 4.0 - 10.5 K/uL   RBC 4.70 3.87 - 5.11 MIL/uL   Hemoglobin 14.5 12.0 - 15.0 g/dL   HCT 11/25/19 36 - 46 %   MCV 94.3 80.0 - 100.0 fL   MCH 30.9 26.0 - 34.0 pg   MCHC 32.7 30.0 - 36.0 g/dL   RDW 77.4 12.8 - 78.6 %    Platelets 274 150 - 400 K/uL   nRBC 0.0 0.0 - 0.2 %  hCG, quantitative, pregnancy     Status: None   Collection Time: 11/23/19 12:04 PM  Result Value Ref Range   hCG, Beta Chain, Quant, S <1 <5 mIU/mL    A Negative UPT Medical screening exam complete  Discussed with patient doing HCG due to negative UPT here and positive at home. Will call with results.   P Discharge from MAU in stable condition Patient given the option of transfer to [facility] for further evaluation or seek care in outpatient facility of choice List of options for follow-up given  Warning signs for worsening condition that would warrant emergency follow-up discussed Patient may return to MAU as needed for pregnancy related complaints  [facility], CNM 11/23/2019 11:17 AM

## 2019-11-23 NOTE — Telephone Encounter (Signed)
Left VM requesting call back to review results.   Rolm Bookbinder, CNM 11/23/19 1:38 PM

## 2019-11-23 NOTE — MAU Note (Signed)
Just found out she was preg, +HPT. Hoping it is all false alarm, as she has had her tubes.   Was having sharp pains earlier on rt side, none now.  No bleeding.had 2 periods last month. (end of July into Aug)

## 2020-01-23 ENCOUNTER — Telehealth: Payer: Self-pay | Admitting: *Deleted

## 2020-01-23 NOTE — Telephone Encounter (Signed)
   Jasey Cortez DOB: 04-26-1990 MRN: [id number]   RIDER WAIVER AND RELEASE OF LIABILITY  For purposes of improving physical access to our facilities, [facility] is pleased to partner with third parties to provide [place] patients or other authorized individuals the option of convenient, on-demand ground transportation services (the [employer]") through use of the technology service that enables users to request on-demand ground transportation from independent third-party providers.  By opting to use and accept these [employer], I, the undersigned, hereby agree on behalf of myself, and on behalf of any minor child using the [employer] for whom I am the parent or legal guardian, as follows:  1. Science writer provided to me are provided by independent third-party transportation providers who are not [employer] or employees and who are unaffiliated with [employer]. 2. [facility] is neither a transportation carrier nor a common or public carrier. 3. [facility] has no control over the quality or safety of the transportation that occurs as a result of the [employer]. 4. [facility] cannot guarantee that any third-party transportation provider will complete any arranged transportation service. 5. [facility] makes no representation, warranty, or guarantee regarding the reliability, timeliness, quality, safety, suitability, or availability of any of the Transport Services or that they will be error free. 6. I fully understand that traveling by vehicle involves risks and dangers of serious bodily injury, including permanent disability, paralysis, and death. I agree, on behalf of myself and on behalf of any minor child using the Transport Services for whom I am the parent or legal guardian, that the entire risk arising out of my use of the [employer] remains solely with me, to the maximum extent permitted under applicable law. 7. The [employer] are provided "as is" and "as available." [facility] disclaims all representations and warranties, express, implied or statutory, not expressly set out in these terms, including the implied warranties of merchantability and fitness for a particular purpose. 8. I hereby waive and release [facility], its agents, employees, officers, directors, representatives, insurers, attorneys, assigns, successors, subsidiaries, and affiliates from any and all past, present, or future claims, demands, liabilities, actions, causes of action, or suits of any kind directly or indirectly arising from acceptance and use of the [employer]. 9. I further waive and release [facility] and its affiliates from all present and future liability and responsibility for any injury or death to persons or damages to property caused by or related to the use of the [employer]. 10. I have read this Waiver and Release of Liability, and I understand the terms used in it and their legal significance. This Waiver is freely and voluntarily given with the understanding that my right (as well as the right of any minor child for whom I am the parent or legal guardian using the [employer]) to legal recourse against  in connection with the [employer] is knowingly surrendered in return for use of these services.   I attest that I read the consent document to Sharen Hint, gave Ms. Fischbach the opportunity to ask questions and answered the questions asked (if any). I affirm that Sharen Hint then provided consent for she's participation in this program.     Evette Doffing

## 2020-10-13 IMAGING — US US MFM FETAL NUCHAL TRANSLUCENCY
1 series · 15 of 28 positions shown · non-contrast
Comparison: none

[Series 1: us mfm fetal nuchal translucency · 15 of 38 slices shown]
[im 1/38]
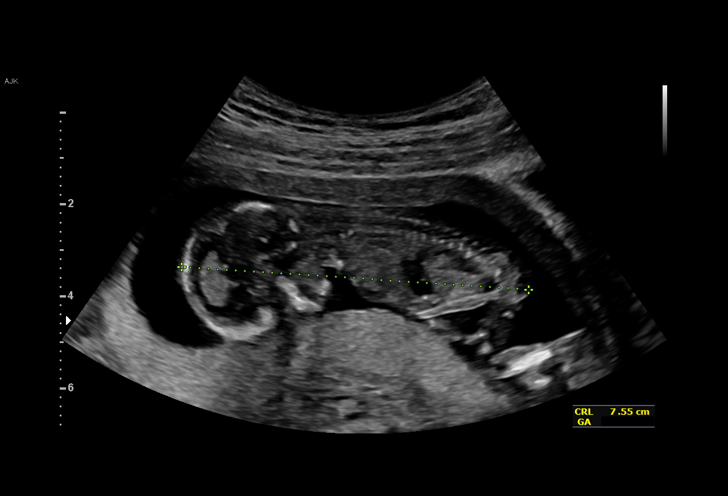
[im 3/38]
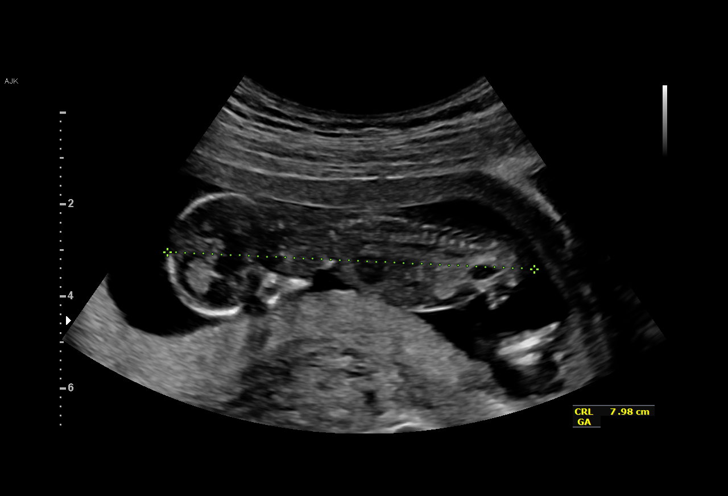
[im 6/38]
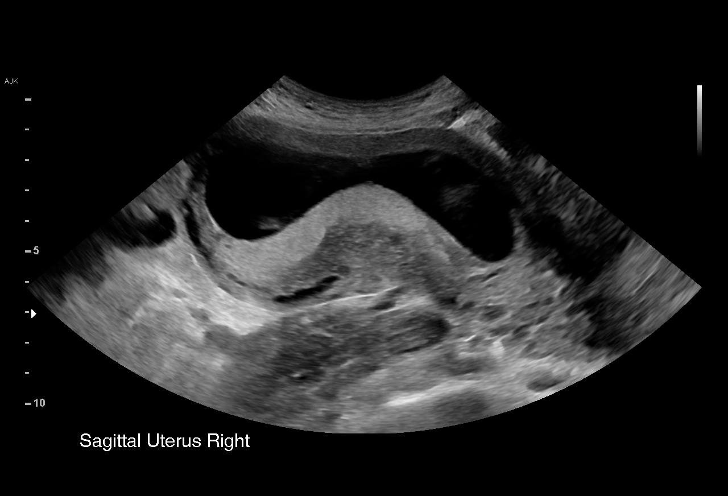
[im 9/38]
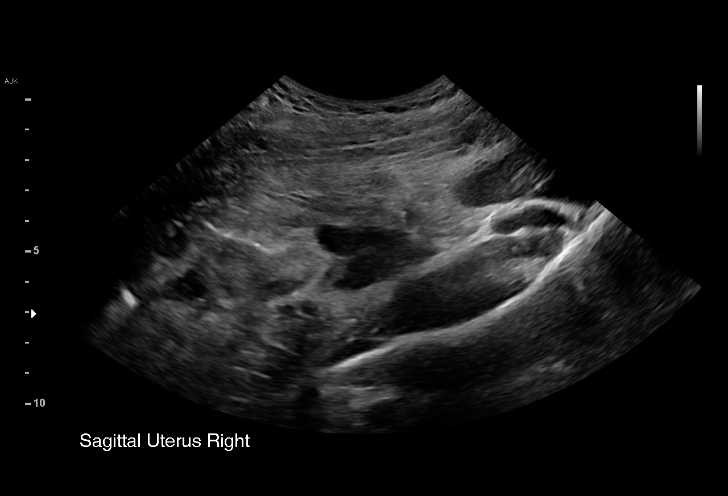
[im 11/38]
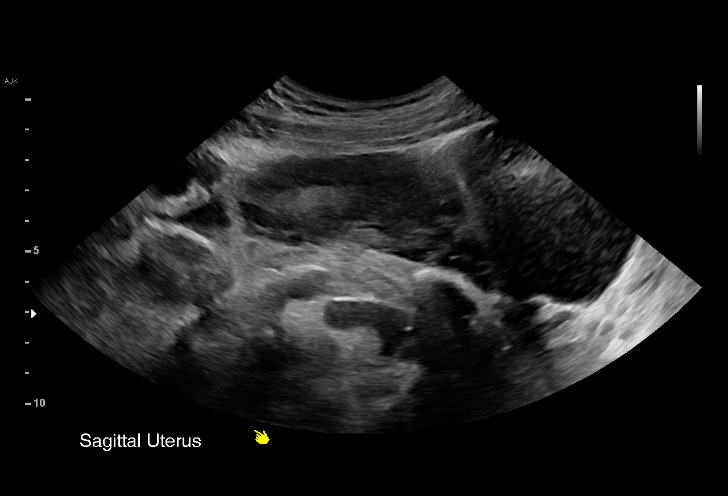
[im 14/38]
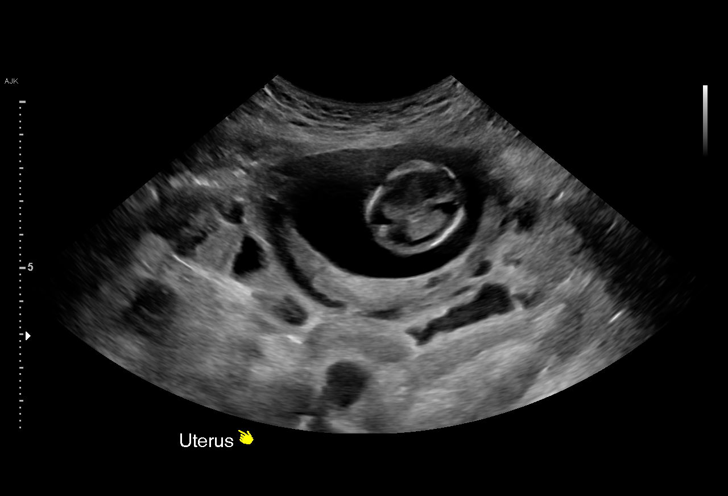
[im 17/38]
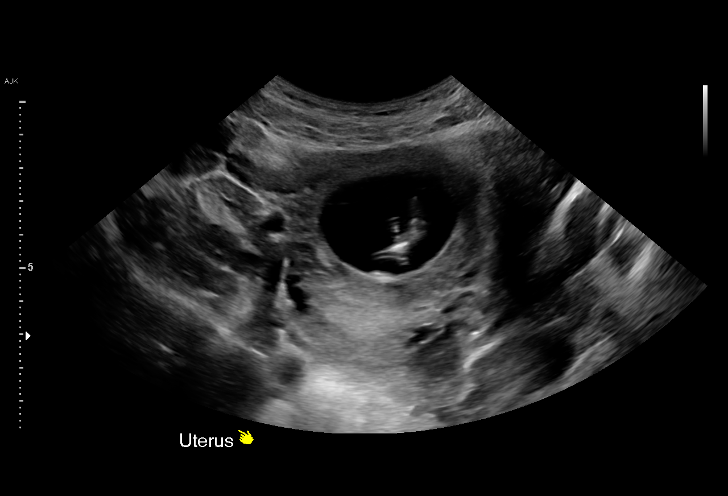
[im 20/38]
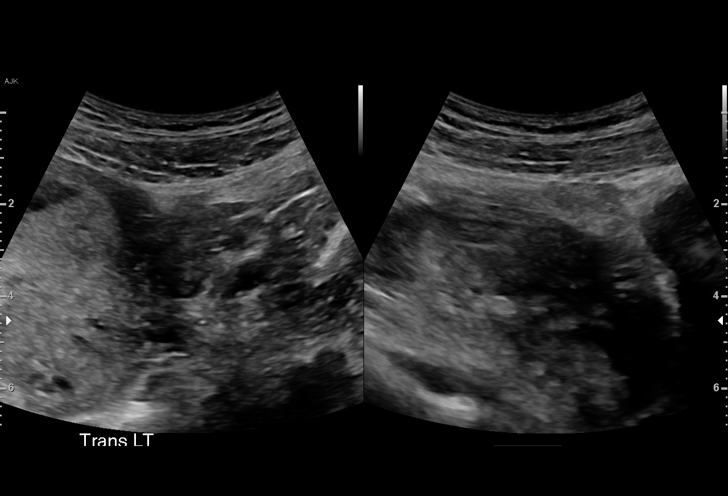
[im 21/38]
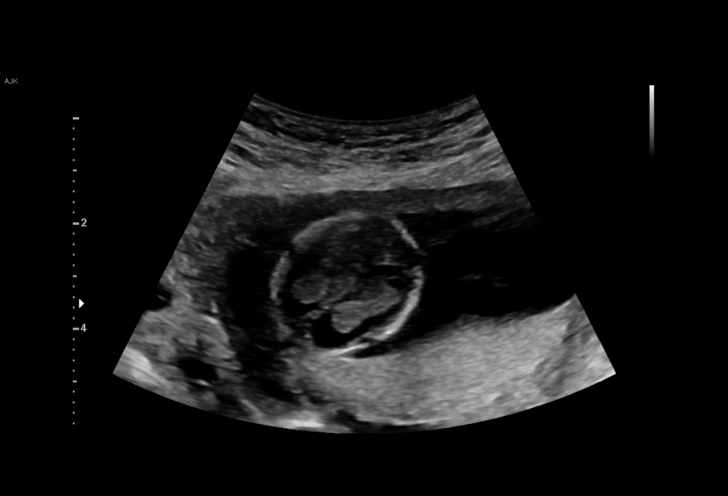
[im 24/38]
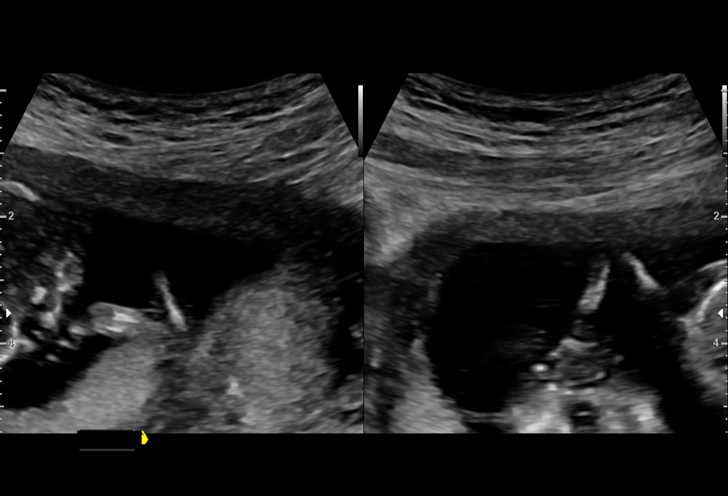
[im 27/38]
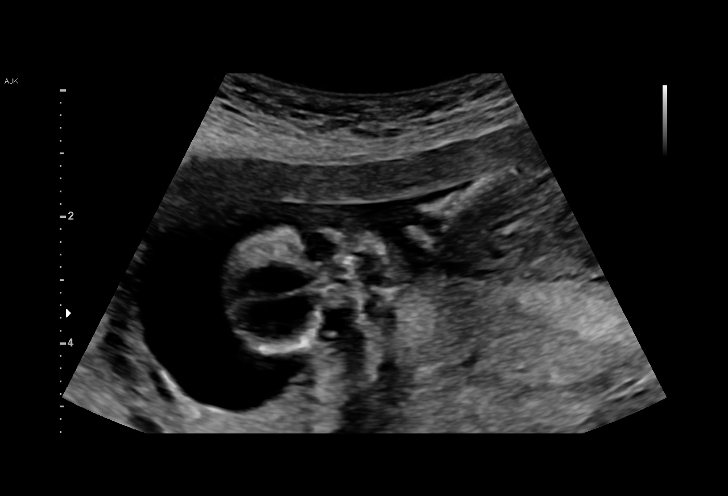
[im 29/38]
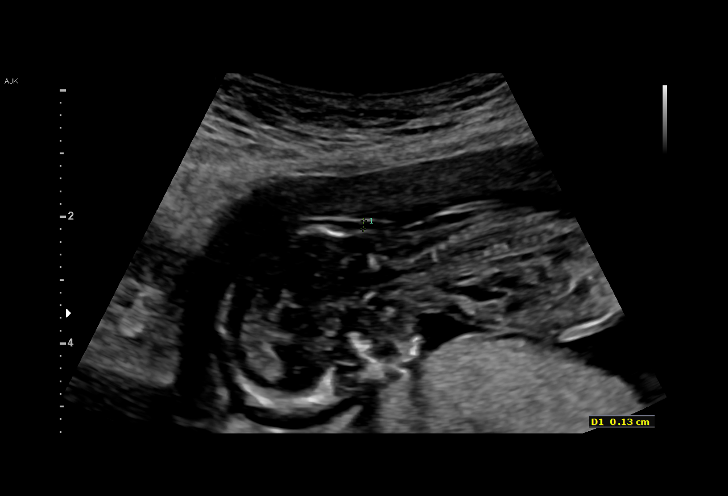
[im 32/38]
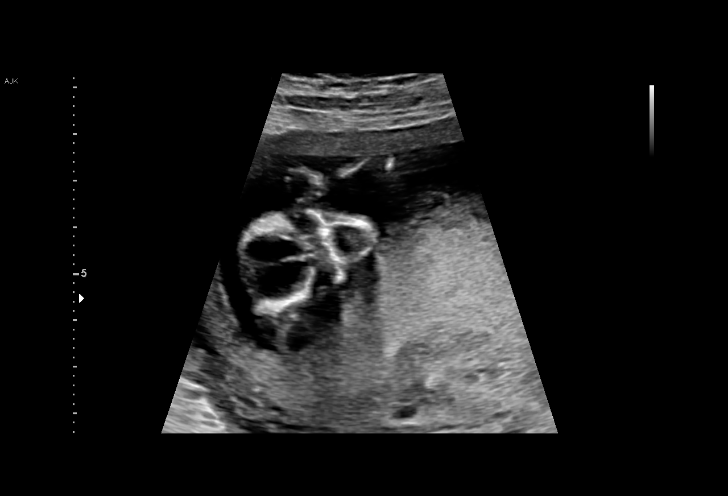
[im 35/38]
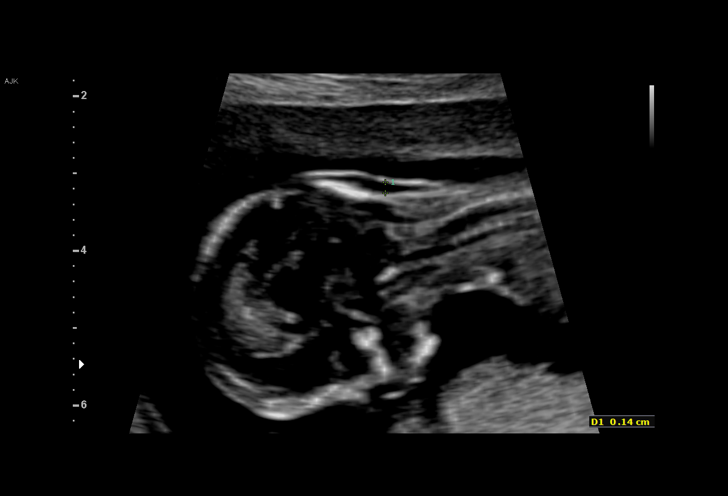
[im 38/38]
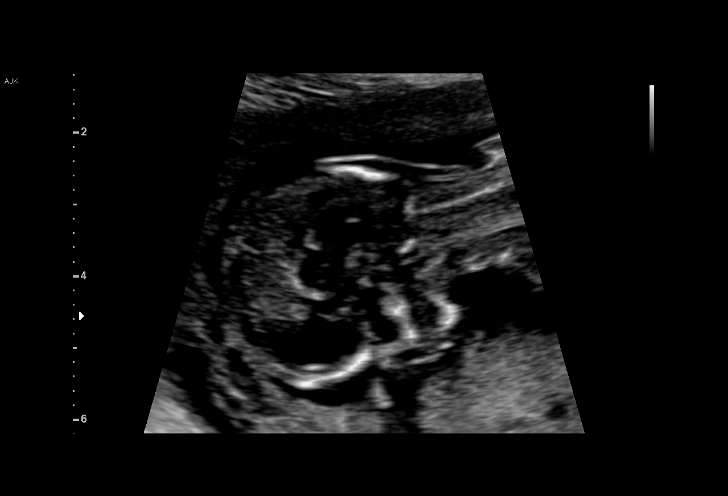

[15 of 28 positions shown; findings below may reference images not displayed]

TRANSLUCENCY
 ----------------------------------------------------------------------

 ----------------------------------------------------------------------
Indications

  Encounter for nuchal translucency
  Smoking complicating pregnancy, first
  trimester
  13 weeks gestation of pregnancy
 ----------------------------------------------------------------------
Vital Signs

 BMI:
Fetal Evaluation

 Num Of Fetuses:         1
 Fetal Heart Rate(bpm):  153
 Cardiac Activity:       Observed
 Placenta:               Posterior

 Amniotic Fluid
 AFI FV:      Within normal limits
Biometry

 CRL:      78.1  mm     G. Age:  13w 4d                  EDD:   12/23/18

 NT:        1.4  mm
OB History

 Gravidity:    2         Term:   1
Gestational Age

 LMP:           13w 2d        Date:  03/20/18                 EDD:   12/25/18
 Best:          13w 2d     Det. By:  LMP  (03/20/18)          EDD:   12/25/18
Impression

 On ultrasound, the CRL measurement is consistent with her
 previously-established dates and good fetal heart activity is
 seen. The nuchal translucency (NT) measures
 millimeters, which is normal. Nasal bone is not visualized
 because of fetal position. Fetal anatomy that could be
 ascertained at this gestational age is normal.
 Blood was drawn for MOATSHE and beta hCG. We will
 communicate first-trimester screening results to the patient.
Recommendations

 Fetal anatomy scan at 20 weeks.
                 [place], [place]
